# Patient Record
Sex: Female | Born: 1970 | Race: White | Hispanic: No | Marital: Married | Smoking: Never smoker
Health system: Southern US, Community
[De-identification: ages and names within clinical notes are randomized; demographics above are authoritative.]

## PROBLEM LIST (undated history)

## (undated) DIAGNOSIS — F909 Attention-deficit hyperactivity disorder, unspecified type: Secondary | ICD-10-CM

## (undated) DIAGNOSIS — J309 Allergic rhinitis, unspecified: Secondary | ICD-10-CM

## (undated) DIAGNOSIS — J45909 Unspecified asthma, uncomplicated: Secondary | ICD-10-CM

## (undated) DIAGNOSIS — Z973 Presence of spectacles and contact lenses: Secondary | ICD-10-CM

## (undated) DIAGNOSIS — I499 Cardiac arrhythmia, unspecified: Secondary | ICD-10-CM

## (undated) DIAGNOSIS — Z87442 Personal history of urinary calculi: Secondary | ICD-10-CM

## (undated) DIAGNOSIS — R000 Tachycardia, unspecified: Secondary | ICD-10-CM

## (undated) HISTORY — DX: Attention-deficit hyperactivity disorder, unspecified type: F90.9

## (undated) HISTORY — DX: Personal history of urinary calculi: Z87.442

## (undated) HISTORY — DX: Tachycardia, unspecified: R00.0

## (undated) HISTORY — DX: Unspecified asthma, uncomplicated: J45.909

## (undated) HISTORY — PX: TUBAL LIGATION: SHX77

## (undated) HISTORY — DX: Allergic rhinitis, unspecified: J30.9

## (undated) HISTORY — PX: SMALL INTESTINE SURGERY: SHX150

## (undated) HISTORY — PX: OTHER SURGICAL HISTORY: SHX169

---

## 1997-07-27 ENCOUNTER — Other Ambulatory Visit: Admission: RE | Admit: 1997-07-27 | Discharge: 1997-07-27 | Payer: Self-pay | Admitting: Obstetrics and Gynecology

## 1998-12-03 ENCOUNTER — Other Ambulatory Visit: Admission: RE | Admit: 1998-12-03 | Discharge: 1998-12-03 | Payer: Self-pay | Admitting: Obstetrics and Gynecology

## 1999-12-05 ENCOUNTER — Other Ambulatory Visit: Admission: RE | Admit: 1999-12-05 | Discharge: 1999-12-05 | Payer: Self-pay | Admitting: Obstetrics and Gynecology

## 2000-06-14 ENCOUNTER — Inpatient Hospital Stay (HOSPITAL_COMMUNITY): Admission: AD | Admit: 2000-06-14 | Discharge: 2000-06-14 | Payer: Self-pay | Admitting: Obstetrics and Gynecology

## 2000-06-14 ENCOUNTER — Encounter: Payer: Self-pay | Admitting: Obstetrics and Gynecology

## 2000-07-30 ENCOUNTER — Inpatient Hospital Stay (HOSPITAL_COMMUNITY): Admission: AD | Admit: 2000-07-30 | Discharge: 2000-07-30 | Payer: Self-pay | Admitting: *Deleted

## 2000-08-17 ENCOUNTER — Inpatient Hospital Stay (HOSPITAL_COMMUNITY): Admission: AD | Admit: 2000-08-17 | Discharge: 2000-08-20 | Payer: Self-pay | Admitting: Obstetrics and Gynecology

## 2000-09-20 ENCOUNTER — Other Ambulatory Visit: Admission: RE | Admit: 2000-09-20 | Discharge: 2000-09-20 | Payer: Self-pay | Admitting: Obstetrics and Gynecology

## 2001-09-21 ENCOUNTER — Other Ambulatory Visit: Admission: RE | Admit: 2001-09-21 | Discharge: 2001-09-21 | Payer: Self-pay | Admitting: Obstetrics and Gynecology

## 2003-11-05 ENCOUNTER — Other Ambulatory Visit: Admission: RE | Admit: 2003-11-05 | Discharge: 2003-11-05 | Payer: Self-pay | Admitting: Obstetrics and Gynecology

## 2004-11-06 ENCOUNTER — Other Ambulatory Visit: Admission: RE | Admit: 2004-11-06 | Discharge: 2004-11-06 | Payer: Self-pay | Admitting: Obstetrics and Gynecology

## 2004-11-17 ENCOUNTER — Encounter: Admission: RE | Admit: 2004-11-17 | Discharge: 2004-11-17 | Payer: Self-pay | Admitting: Obstetrics and Gynecology

## 2012-12-22 ENCOUNTER — Other Ambulatory Visit: Payer: Self-pay | Admitting: Family Medicine

## 2012-12-22 ENCOUNTER — Ambulatory Visit
Admission: RE | Admit: 2012-12-22 | Discharge: 2012-12-22 | Disposition: A | Discharge status: Home / Self Care | Payer: Self-pay | Source: Ambulatory Visit | Attending: Family Medicine | Admitting: Family Medicine

## 2012-12-22 DIAGNOSIS — R3129 Other microscopic hematuria: Secondary | ICD-10-CM

## 2012-12-22 DIAGNOSIS — R109 Unspecified abdominal pain: Secondary | ICD-10-CM

## 2013-01-26 ENCOUNTER — Other Ambulatory Visit: Payer: Self-pay | Admitting: Obstetrics and Gynecology

## 2013-01-26 DIAGNOSIS — R928 Other abnormal and inconclusive findings on diagnostic imaging of breast: Secondary | ICD-10-CM

## 2013-02-03 ENCOUNTER — Ambulatory Visit
Admission: RE | Admit: 2013-02-03 | Discharge: 2013-02-03 | Disposition: A | Discharge status: Home / Self Care | Payer: BC Managed Care – PPO | Source: Ambulatory Visit | Attending: Obstetrics and Gynecology | Admitting: Obstetrics and Gynecology

## 2013-02-03 ENCOUNTER — Other Ambulatory Visit: Payer: Self-pay | Admitting: Obstetrics and Gynecology

## 2013-02-03 DIAGNOSIS — R921 Mammographic calcification found on diagnostic imaging of breast: Secondary | ICD-10-CM

## 2013-02-03 DIAGNOSIS — R928 Other abnormal and inconclusive findings on diagnostic imaging of breast: Secondary | ICD-10-CM

## 2013-02-09 ENCOUNTER — Ambulatory Visit
Admission: RE | Admit: 2013-02-09 | Discharge: 2013-02-09 | Disposition: A | Discharge status: Home / Self Care | Payer: BC Managed Care – PPO | Source: Ambulatory Visit | Attending: Obstetrics and Gynecology | Admitting: Obstetrics and Gynecology

## 2013-02-09 DIAGNOSIS — R921 Mammographic calcification found on diagnostic imaging of breast: Secondary | ICD-10-CM

## 2013-11-08 ENCOUNTER — Ambulatory Visit
Admission: RE | Admit: 2013-11-08 | Discharge: 2013-11-08 | Disposition: A | Discharge status: Home / Self Care | Payer: BC Managed Care – PPO | Source: Ambulatory Visit | Attending: Family Medicine | Admitting: Family Medicine

## 2013-11-08 ENCOUNTER — Other Ambulatory Visit: Payer: Self-pay | Admitting: Family Medicine

## 2013-11-08 ENCOUNTER — Other Ambulatory Visit: Payer: BC Managed Care – PPO

## 2013-11-08 DIAGNOSIS — R2241 Localized swelling, mass and lump, right lower limb: Secondary | ICD-10-CM

## 2013-11-08 DIAGNOSIS — M25561 Pain in right knee: Secondary | ICD-10-CM

## 2013-11-08 DIAGNOSIS — R29898 Other symptoms and signs involving the musculoskeletal system: Secondary | ICD-10-CM

## 2013-11-16 ENCOUNTER — Ambulatory Visit
Admission: RE | Admit: 2013-11-16 | Discharge: 2013-11-16 | Disposition: A | Discharge status: Home / Self Care | Payer: BC Managed Care – PPO | Source: Ambulatory Visit | Attending: Family Medicine | Admitting: Family Medicine

## 2013-11-16 DIAGNOSIS — M25561 Pain in right knee: Secondary | ICD-10-CM

## 2013-11-16 DIAGNOSIS — R29898 Other symptoms and signs involving the musculoskeletal system: Secondary | ICD-10-CM

## 2013-11-16 MED ORDER — GADOBENATE DIMEGLUMINE 529 MG/ML IV SOLN
10.0000 mL | Freq: Once | INTRAVENOUS | Status: AC | PRN
  Administered 2013-11-16: 10 mL via INTRAVENOUS

## 2014-01-30 ENCOUNTER — Other Ambulatory Visit: Payer: Self-pay | Admitting: Obstetrics and Gynecology

## 2014-02-01 LAB — CYTOLOGY - PAP

## 2014-02-23 ENCOUNTER — Other Ambulatory Visit: Payer: Self-pay | Admitting: Obstetrics and Gynecology

## 2014-08-31 ENCOUNTER — Other Ambulatory Visit: Payer: Self-pay | Admitting: Obstetrics and Gynecology

## 2014-09-04 LAB — CYTOLOGY - PAP

## 2014-12-10 IMAGING — MG MM DIAGNOSTIC UNILATERAL L
2 series · 2 of 2 positions shown · non-contrast
Comparison: Previous mammograms

ACR Breast Density Category c: The breast tissue is heterogeneously
dense.

CLINICAL DATA: Screening recall for left breast calcifications.

EXAM:
DIGITAL DIAGNOSTIC  left MAMMOGRAM

[L CC]
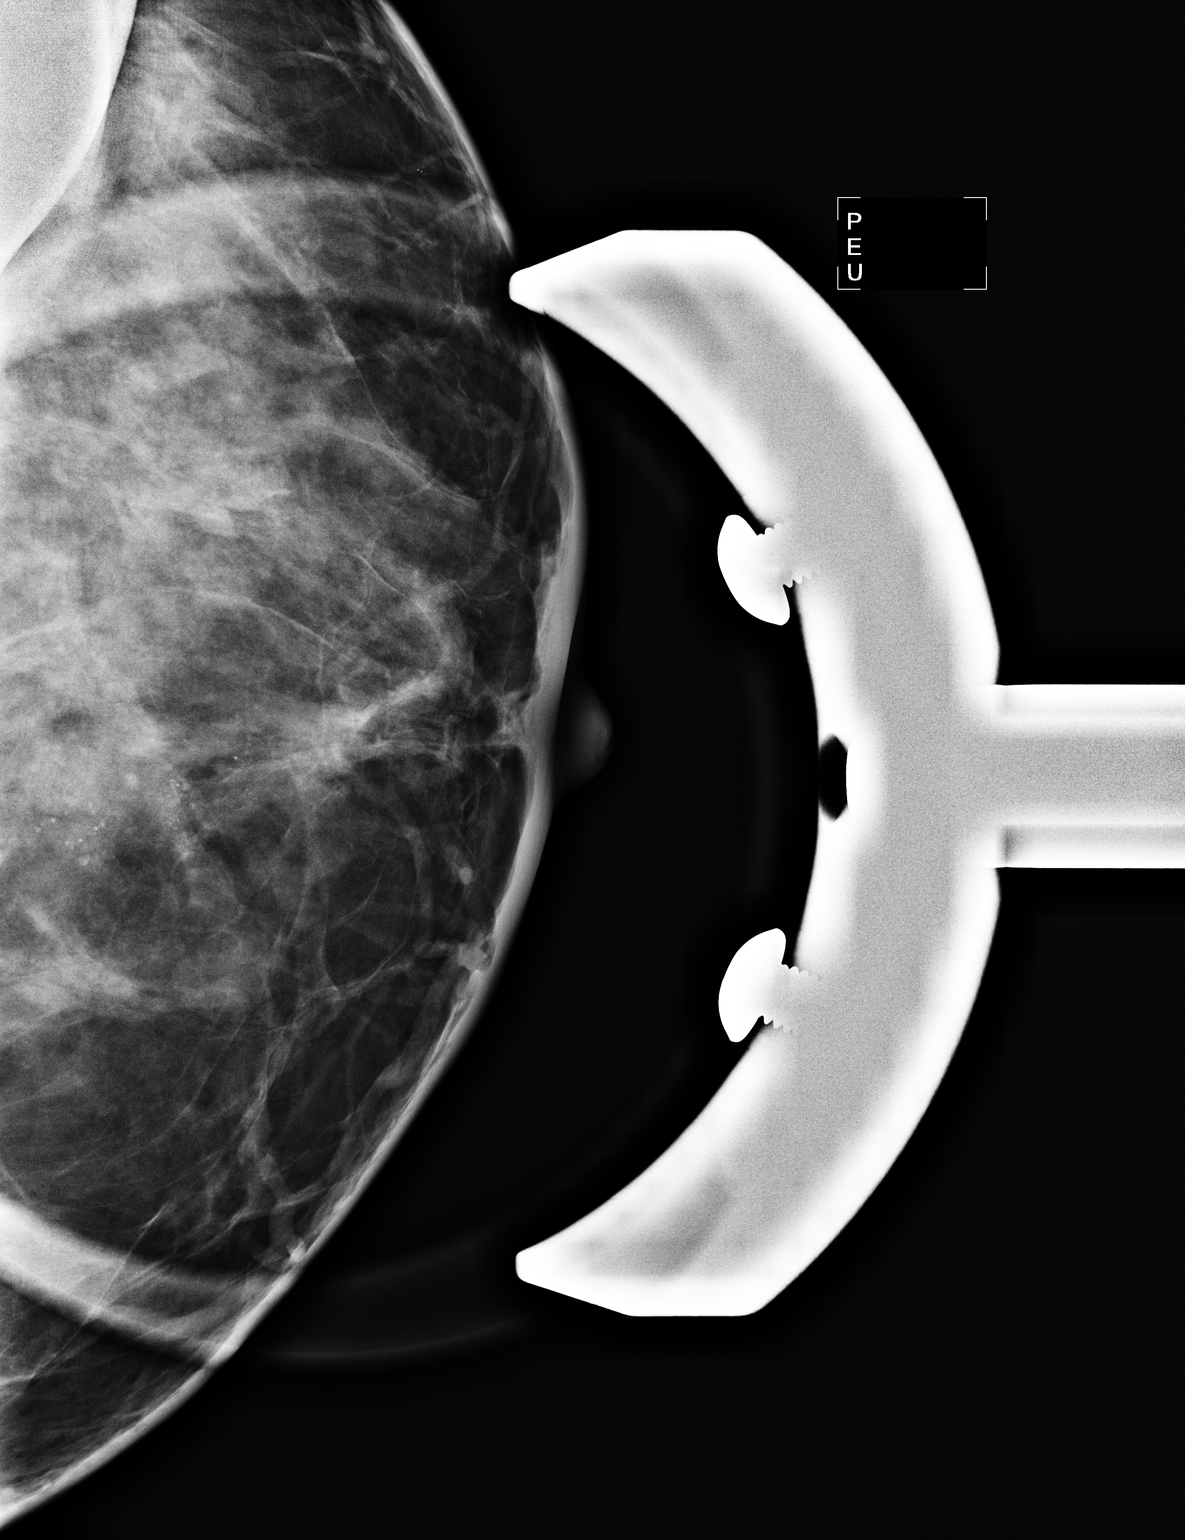

[L ML]
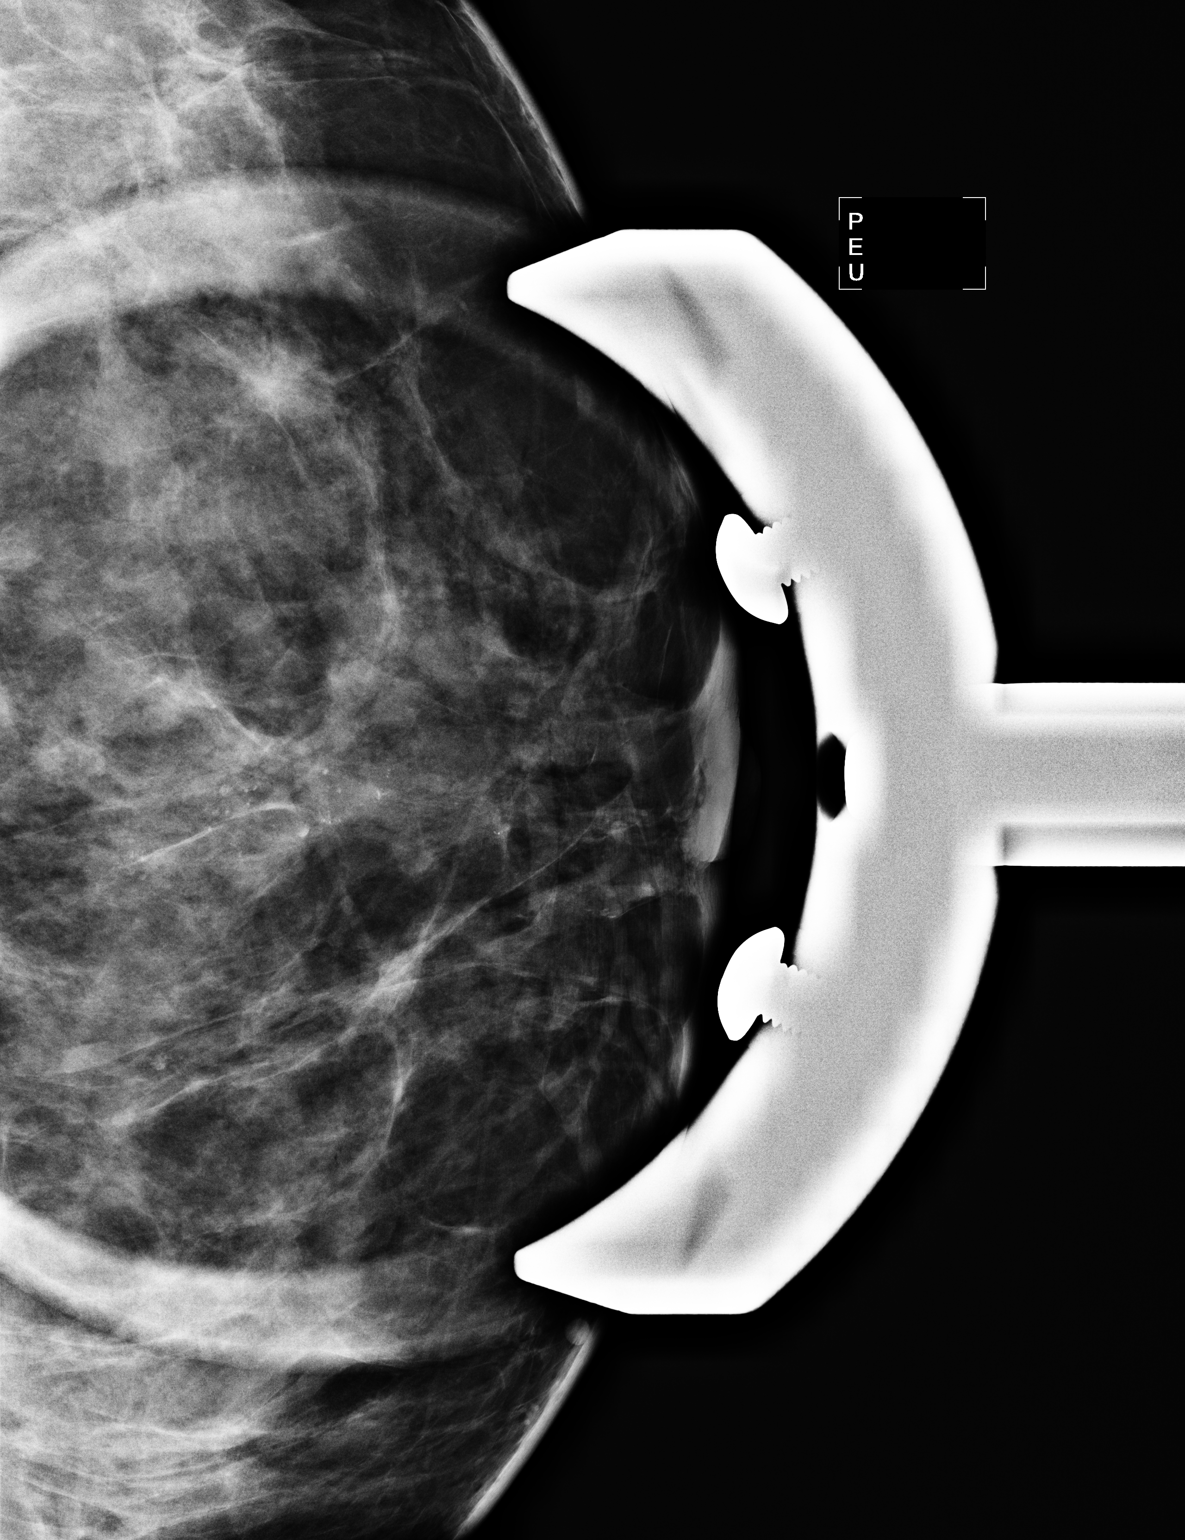

[2 of 2 positions shown; findings below may reference images not displayed]

FINDINGS: There grouped calcifications in the central left breast anterior to
mid depth, confirmed on the additional spot compression
magnification CC and true lateral views, some of which (but not all)
demonstrate layering consistent with milk of calcium.
IMPRESSION: Grouped calcifications in the central left breast, some of which
layer consistent with milk of calcium.

RECOMMENDATION:
The patient was given the option of six-month followup diagnostic
mammogram versus stereotactic biopsy of the calcifications. The
patient elects biopsy of the calcifications, and this is scheduled

I have discussed the findings and recommendations with the patient.
Results were also provided in writing at the conclusion of the
visit. If applicable, a reminder letter will be sent to the patient
regarding the next appointment.

BI-RADS CATEGORY  4: Suspicious abnormality - biopsy should be
considered.

## 2015-03-25 ENCOUNTER — Encounter: Payer: Self-pay | Admitting: Cardiovascular Disease

## 2015-03-25 ENCOUNTER — Ambulatory Visit (INDEPENDENT_AMBULATORY_CARE_PROVIDER_SITE_OTHER): Payer: BC Managed Care – PPO | Admitting: Cardiovascular Disease

## 2015-03-25 VITALS — BP 120/70 | HR 82 | Ht 63.0 in | Wt 128.5 lb

## 2015-03-25 DIAGNOSIS — R0689 Other abnormalities of breathing: Secondary | ICD-10-CM | POA: Diagnosis not present

## 2015-03-25 DIAGNOSIS — I471 Supraventricular tachycardia: Secondary | ICD-10-CM | POA: Diagnosis not present

## 2015-03-25 DIAGNOSIS — J45909 Unspecified asthma, uncomplicated: Secondary | ICD-10-CM | POA: Insufficient documentation

## 2015-03-25 DIAGNOSIS — F909 Attention-deficit hyperactivity disorder, unspecified type: Secondary | ICD-10-CM | POA: Diagnosis not present

## 2015-03-25 DIAGNOSIS — R002 Palpitations: Secondary | ICD-10-CM | POA: Diagnosis not present

## 2015-03-25 MED ORDER — PROPRANOLOL HCL 10 MG PO TABS: 10.0000 mg | ORAL_TABLET | Freq: Three times a day (TID) | ORAL | Status: DC | PRN

## 2015-03-25 NOTE — Assessment & Plan Note (Addendum)
Relatively acute onset of her tachycardia is concerning for atrial tachycardia Unable to exclude sinus tachycardia Less likely a supraventricular tachycardia given the slower speed Rhythm seems regular, less likely atrial fibrillation Recommended if symptoms come back when they are severe, 48-hour Holter monitor or 30 day event monitor could be ordered For now would recommend propranolol 10 mg with repeat 1 if needed for symptoms If symptoms persist through the day, could take metoprolol tartrate 25 mg. She will call if she would like the latter Etiology of her general malaise, extreme fatigue is unclear, unable to exclude viral etiology. This may have triggered her symptoms rather than her arrhythmia triggering her fatigue EKG normal, clinical examination normal, no echocardiogram ordered at this time given likely normal cardiac structure

## 2015-03-25 NOTE — Patient Instructions (Addendum)
You are doing well.  I think you may be having episodes of atrial tachycardia Rate typically 100 to 150 bpm  Please take propranolol as needed for tachycardia 1 to 2 up to every 4 to 6 hours  Please call if you would like a 24/48 or 30 day monitor   Please call Claudia Hawkins if you have new issues that need to be addressed before your next appt.    Tachycardia Tachycardia is a faster than normal heartbeat (more than 100 beats per minute). In adults, the heart normally beats between 60 and 100 times a minute. A fast heartbeat may be a normal response to exercise or stress. It does not necessarily mean that something is wrong. However, sometimes when your heart beats too fast it may not be able to pump enough blood to the rest of your body. This can result in chest pain, shortness of breath, dizziness, and even fainting. Nonspecific tachycardia means that the specific cause or pattern of your tachycardia is unknown. CAUSES  Tachycardia may be harmless or it may be due to a more serious underlying cause. Possible causes of tachycardia include:  Exercise or exertion.  Fever.  Pain or injury.  Infection.  Loss of body fluids (dehydration).  Overactive thyroid.  Lack of red blood cells (anemia).  Anxiety and stress.  Alcohol.  Caffeine.  Tobacco products.  Diet pills.  Illegal drugs.  Heart disease. SYMPTOMS  Rapid or irregular heartbeat (palpitations).  Suddenly feeling your heart beating (cardiac awareness).  Dizziness.  Tiredness (fatigue).  Shortness of breath.  Chest pain.  Nausea.  Fainting. DIAGNOSIS  Your caregiver will perform a physical exam and take your medical history. In some cases, a heart specialist (cardiologist) may be consulted. Your caregiver may also order:  Blood tests.  Electrocardiography. This test records the electrical activity of your heart.  A heart monitoring test. TREATMENT  Treatment will depend on the likely cause of your  tachycardia. The goal is to treat the underlying cause of your tachycardia. Treatment methods may include:  Replacement of fluids or blood through an intravenous (IV) tube for moderate to severe dehydration or anemia.  New medicines or changes in your current medicines.  Diet and lifestyle changes.  Treatment for certain infections.  Stress relief or relaxation methods. HOME CARE INSTRUCTIONS   Rest.  Drink enough fluids to keep your urine clear or pale yellow.  Do not smoke.  Avoid:  Caffeine.  Tobacco.  Alcohol.  Chocolate.  Stimulants such as over-the-counter diet pills or pills that help you stay awake.  Situations that cause anxiety or stress.  Illegal drugs such as marijuana, phencyclidine (PCP), and cocaine.  Only take medicine as directed by your caregiver.  Keep all follow-up appointments as directed by your caregiver. SEEK IMMEDIATE MEDICAL CARE IF:   You have pain in your chest, upper arms, jaw, or neck.  You become weak, dizzy, or feel faint.  You have palpitations that will not go away.  You vomit, have diarrhea, or pass blood in your stool.  Your skin is cool, pale, and wet.  You have a fever that will not go away with rest, fluids, and medicine. MAKE SURE YOU:   Understand these instructions.  Will watch your condition.  Will get help right away if you are not doing well or get worse.   This information is not intended to replace advice given to you by your health care provider. Make sure you discuss any questions you have with your health  care provider.   Document Released: 06/04/2004 Document Revised: 07/20/2011 Document Reviewed: 11/09/2014 Elsevier Interactive Patient Education [employer]

## 2015-03-25 NOTE — Assessment & Plan Note (Signed)
Stable, no recent exacerbation 

## 2015-03-25 NOTE — Progress Notes (Signed)
Patient ID: Claudia Hawkins, female    DOB: [birth year], 44 y.o.   MRN: [id number]  HPI Comments: Miss Claudia Hawkins is a very pleasant [age] year old woman with history of ADHD, asthma who presents for evaluation of tachycardia  She reports symptoms date at least one month. She has acute onset of tachycardia without warning, heart rates typically more than 100. Friend of hers measured heart rate 120 bpm. There is a notable rapid onset of the tachycardia unrelated to exertion or stress. Never had symptoms like this before. Symptoms lasted 10 days or more, now more rare. Was bad in middle of October when she saw primary care. Typically can exert herself without any symptoms. Around the same time as her tachycardia, she had general malaise, extreme fatigue. Etiology unclear. Not notably sick at the time She has been on Vyvanse for many years Currently feels well with only rare episodes. She does appreciate the tachycardia in the daytime as well as nighttime. Typically does not wake her from sleep  Recent lab work reviewed with her in detail showing normal CBC, BMP, TSH  EKG on today's visit shows normal sinus rhythm with rate 82 bpm, no significant ST or T-wave changes   No Known Allergies  No current outpatient prescriptions on file prior to visit.   No current facility-administered medications on file prior to visit.    Past Medical History  Diagnosis Date  . Allergic rhinitis   . Asthma   . History of kidney stones   . Adult ADHD     Past Surgical History  Procedure Laterality Date  . Tubal ligation      Social History  reports that she has never smoked. She does not have any smokeless tobacco history on file. She reports that she drinks alcohol. She reports that she does not use illicit drugs.  Family History family history includes Hypertension in her father.    Review of Systems  Constitutional: Negative.   Respiratory: Negative.   Cardiovascular: Positive for  palpitations.       Tachycardia  Gastrointestinal: Negative.   Musculoskeletal: Negative.   Skin: Negative.   Neurological: Negative.   Hematological: Negative.   Psychiatric/Behavioral: Negative.   All other systems reviewed and are negative.   BP 120/70 mmHg  Pulse 82  Ht 5\' 3"  (1.6 m)  Wt 128 lb 8 oz (58.287 kg)  BMI 22.77 kg/m2  Physical Exam  Constitutional: She is oriented to person, place, and time. She appears well-developed and well-nourished.  HENT:  Head: Normocephalic.  Nose: Nose normal.  Mouth/Throat: Oropharynx is clear and moist.  Eyes: Conjunctivae are normal. Pupils are equal, round, and reactive to light.  Neck: Normal range of motion. Neck supple. No JVD present.  Cardiovascular: Normal rate, regular rhythm, normal heart sounds and intact distal pulses.  Exam reveals no gallop and no friction rub.   No murmur heard. Pulmonary/Chest: Effort normal and breath sounds normal. No respiratory distress. She has no wheezes. She has no rales. She exhibits no tenderness.  Abdominal: Soft. Bowel sounds are normal. She exhibits no distension. There is no tenderness.  Musculoskeletal: Normal range of motion. She exhibits no edema or tenderness.  Lymphadenopathy:    She has no cervical adenopathy.  Neurological: She is alert and oriented to person, place, and time. Coordination normal.  Skin: Skin is warm and dry. No rash noted. No erythema.  Psychiatric: She has a normal mood and affect. Her behavior is normal. Judgment and thought content normal.

## 2015-03-25 NOTE — Assessment & Plan Note (Signed)
Currently on Vyvanse for many years No plan to discontinue the medication Likely unrelated

## 2015-07-02 ENCOUNTER — Other Ambulatory Visit: Payer: Self-pay | Admitting: Family Medicine

## 2015-07-02 ENCOUNTER — Ambulatory Visit
Admission: RE | Admit: 2015-07-02 | Discharge: 2015-07-02 | Disposition: A | Discharge status: Home / Self Care | Payer: BC Managed Care – PPO | Source: Ambulatory Visit | Attending: Family Medicine | Admitting: Family Medicine

## 2015-07-02 DIAGNOSIS — R05 Cough: Secondary | ICD-10-CM

## 2015-07-02 DIAGNOSIS — R059 Cough, unspecified: Secondary | ICD-10-CM

## 2016-07-17 ENCOUNTER — Other Ambulatory Visit: Payer: Self-pay | Admitting: Obstetrics and Gynecology

## 2016-07-17 DIAGNOSIS — R928 Other abnormal and inconclusive findings on diagnostic imaging of breast: Secondary | ICD-10-CM

## 2016-07-29 ENCOUNTER — Other Ambulatory Visit: Payer: Self-pay | Admitting: Obstetrics and Gynecology

## 2016-07-29 ENCOUNTER — Ambulatory Visit
Admission: RE | Admit: 2016-07-29 | Discharge: 2016-07-29 | Disposition: A | Discharge status: Home / Self Care | Payer: BC Managed Care – PPO | Source: Ambulatory Visit | Attending: Obstetrics and Gynecology | Admitting: Obstetrics and Gynecology

## 2016-07-29 DIAGNOSIS — R928 Other abnormal and inconclusive findings on diagnostic imaging of breast: Secondary | ICD-10-CM

## 2016-07-29 DIAGNOSIS — N6310 Unspecified lump in the right breast, unspecified quadrant: Secondary | ICD-10-CM

## 2016-07-30 ENCOUNTER — Ambulatory Visit
Admission: RE | Admit: 2016-07-30 | Discharge: 2016-07-30 | Disposition: A | Discharge status: Home / Self Care | Payer: BC Managed Care – PPO | Source: Ambulatory Visit | Attending: Obstetrics and Gynecology | Admitting: Obstetrics and Gynecology

## 2016-07-30 ENCOUNTER — Other Ambulatory Visit: Payer: Self-pay | Admitting: Obstetrics and Gynecology

## 2016-07-30 DIAGNOSIS — N6310 Unspecified lump in the right breast, unspecified quadrant: Secondary | ICD-10-CM

## 2017-05-05 ENCOUNTER — Other Ambulatory Visit: Payer: Self-pay | Admitting: Family Medicine

## 2017-05-05 ENCOUNTER — Ambulatory Visit
Admission: RE | Admit: 2017-05-05 | Discharge: 2017-05-05 | Disposition: A | Discharge status: Home / Self Care | Payer: BC Managed Care – PPO | Source: Ambulatory Visit | Attending: Family Medicine | Admitting: Family Medicine

## 2017-05-05 DIAGNOSIS — R52 Pain, unspecified: Secondary | ICD-10-CM

## 2018-10-26 DIAGNOSIS — R000 Tachycardia, unspecified: Secondary | ICD-10-CM | POA: Insufficient documentation

## 2019-10-27 DIAGNOSIS — Z9889 Other specified postprocedural states: Secondary | ICD-10-CM | POA: Insufficient documentation

## 2019-12-05 DIAGNOSIS — U071 COVID-19: Secondary | ICD-10-CM

## 2019-12-05 HISTORY — DX: COVID-19: U07.1

## 2020-01-16 ENCOUNTER — Encounter (HOSPITAL_COMMUNITY): Payer: Self-pay

## 2020-01-16 NOTE — Progress Notes (Signed)
Claudia Hawkins  01/16/2020      Your procedure is scheduled on    01/29/20   Report to [facility]  at      0530 A.M.  Call this number if you have problems the morning of surgery:[phone]  OUR ADDRESS IS [address], WE ARE LOCATED IN THE [facility] WITH [facility].   Remember:  Do not eat food  after midnight.   NO SOLID FOOD AFTER MIDNIGHT THE NIGHT PRIOR TO SURGERY. NOTHING BY MOUTH EXCEPT CLEAR LIQUIDS UNTIL    0430am . PLEASE FINISH ENSURE DRINK PER SURGEON ORDER  WHICH NEEDS TO BE COMPLETED AT 0430am.  Take these medicines the morning of surgery with A SIP OF WATER Propanolol if needed, vyvanse   Do not wear jewelry, make-up or nail polish.  Do not wear lotions, powders, or perfumes, or deoderant.  Do not shave 48 hours prior to surgery.  Men may shave face and neck.  Do not bring valuables to the hospital.  [facility] is not responsible for any belongings or valuables.  Contacts, dentures or bridgework may not be worn into surgery.  Leave your suitcase in the car.  After surgery it may be brought to your room.  For patients admitted to the hospital, discharge time will be determined by your treatment team.  Patients discharged the day of surgery will not be allowed to drive home.     Please read over the following fact sheets that you were given:  [facility] - Preparing for Surgery Before surgery, you can play an important role.  Because skin is not sterile, your skin needs to be as free of germs as possible.  You can reduce the number of germs on your skin by washing with CHG (chlorahexidine gluconate) soap before surgery.  CHG is an antiseptic cleaner which kills germs and bonds with the skin to continue killing germs even after washing. Please DO NOT use if you have an allergy to CHG or antibacterial soaps.  If your skin becomes reddened/irritated stop using the CHG and inform your nurse when you arrive at Short Stay. Do not shave  (including legs and underarms) for at least 48 hours prior to the first CHG shower.  You may shave your face/neck. Please follow these instructions carefully:  1.  Shower with CHG Soap the night before surgery and the  morning of Surgery.  2.  If you choose to wash your hair, wash your hair first as usual with your  normal  shampoo.  3.  After you shampoo, rinse your hair and body thoroughly to remove the  shampoo.                           4.  Use CHG as you would any other liquid soap.  You can apply chg directly  to the skin and wash                       Gently with a scrungie or clean washcloth.  5.  Apply the CHG Soap to your body ONLY FROM THE NECK DOWN.   Do not use on face/ open                           Wound or open sores. Avoid contact with eyes, ears mouth and genitals (private parts).  Wash face,  Genitals (private parts) with your normal soap.             6.  Wash thoroughly, paying special attention to the area where your surgery  will be performed.  7.  Thoroughly rinse your body with warm water from the neck down.  8.  DO NOT shower/wash with your normal soap after using and rinsing off  the CHG Soap.                9.  Pat yourself dry with a clean towel.            10.  Wear clean pajamas.            11.  Place clean sheets on your bed the night of your first shower and do not  sleep with pets. Day of Surgery : Do not apply any lotions/deodorants the morning of surgery.  Please wear clean clothes to the hospital/surgery center.  FAILURE TO FOLLOW THESE INSTRUCTIONS MAY RESULT IN THE CANCELLATION OF YOUR SURGERY PATIENT SIGNATURE_________________________________  NURSE SIGNATURE__________________________________  ________________________________________________________________________

## 2020-01-17 ENCOUNTER — Other Ambulatory Visit: Payer: Self-pay

## 2020-01-17 ENCOUNTER — Encounter (HOSPITAL_COMMUNITY): Payer: Self-pay

## 2020-01-17 ENCOUNTER — Encounter (HOSPITAL_COMMUNITY)
Admission: RE | Admit: 2020-01-17 | Discharge: 2020-01-17 | Disposition: A | Discharge status: Home / Self Care | Payer: BC Managed Care – PPO | Source: Ambulatory Visit | Attending: Obstetrics and Gynecology | Admitting: Obstetrics and Gynecology

## 2020-01-17 ENCOUNTER — Encounter (INDEPENDENT_AMBULATORY_CARE_PROVIDER_SITE_OTHER): Payer: Self-pay

## 2020-01-17 DIAGNOSIS — Z01818 Encounter for other preprocedural examination: Secondary | ICD-10-CM | POA: Diagnosis not present

## 2020-01-17 HISTORY — DX: Cardiac arrhythmia, unspecified: I49.9

## 2020-01-17 LAB — CBC
HCT: 44.6 % (ref 36.0–46.0)
Hemoglobin: 14.8 g/dL (ref 12.0–15.0)
MCH: 30 pg (ref 26.0–34.0)
MCHC: 33.2 g/dL (ref 30.0–36.0)
MCV: 90.5 fL (ref 80.0–100.0)
Platelets: 257 10*3/uL (ref 150–400)
RBC: 4.93 MIL/uL (ref 3.87–5.11)
RDW: 12.5 % (ref 11.5–15.5)
WBC: 7.8 10*3/uL (ref 4.0–10.5)
nRBC: 0 % (ref 0.0–0.2)

## 2020-01-25 ENCOUNTER — Inpatient Hospital Stay (HOSPITAL_COMMUNITY)
Admission: RE | Admit: 2020-01-25 | Discharge: 2020-01-25 | Disposition: A | Discharge status: Home / Self Care | Payer: BC Managed Care – PPO | Source: Ambulatory Visit

## 2020-01-25 ENCOUNTER — Encounter (HOSPITAL_BASED_OUTPATIENT_CLINIC_OR_DEPARTMENT_OTHER): Payer: Self-pay | Admitting: Obstetrics and Gynecology

## 2020-01-25 NOTE — Progress Notes (Signed)
Pt arrived to pre-procedure covid testing and presented a copy of her positive covid test on 12/05/2019. Pt will not require another covid test prior to procedure d/t being positive in the last 90 days per hospital guidelines.   Viviano Simas, RN

## 2020-01-25 NOTE — Progress Notes (Signed)
Patient spoke to by phone patient aware to bring copy of covid positive test result  From 7-27-2021with her day of surgery

## 2020-01-26 NOTE — H&P (Signed)
Claudia Hawkins  is an 49 y.o. female. She has discomfort with pelvic prolapse and urinary incontinence. Also S/P LEEP in '14 and now with persistent CIN I.  Pertinent Gynecological History: Menses:  Bleeding: none Contraception: tubal ligation DES exposure: denies Blood transfusions: none Sexually transmitted diseases: no past history Previous GYN Procedures: EMA, BTL, LEEP  Last mammogram: normal Date: 2021 Last pap: abnormal: CIN I Date: 6/21 OB History: G3, P3   Menstrual History: Menarche age: [age]kn[age] No LMP recorded. Patient has had an ablation.    Past Medical History:  Diagnosis Date  . Adult ADHD   . Allergic rhinitis   . Asthma    very rarely uses albuterol   . COVID-19 12/05/2019  . Dysrhythmia    tachycardia   . History of kidney stones     Past Surgical History:  Procedure Laterality Date  . right knee surgery     . right wrist surgery     . torn meniscus right knee surgery     . TUBAL LIGATION      Family History  Problem Relation Age of Onset  . Hypertension Father     Social History:  reports that she has never smoked. She has never used smokeless tobacco. She reports current alcohol use. She reports that she does not use drugs.  Allergies:  Allergies  Allergen Reactions  . Bactroban [Mupirocin] Itching and Rash    At application site   . Benzalkonium Chloride Rash  . Neosporin [Bacitracin-Polymyxin B] Itching and Rash    At application site  . Oxycontin [Oxycodone] Hives, Itching and Rash  . Sulfa Antibiotics Hives, Diarrhea, Nausea And Vomiting and Rash    No medications prior to admission.    Review of Systems  Constitutional: Negative for fever.    There were no vitals taken for this visit. Physical Exam Cardiovascular:     Rate and Rhythm: Normal rate.  Pulmonary:     Effort: Pulmonary effort is normal.  Genitourinary:    Comments: Uterus first degree prolapse Cysotcele second degree prolapse Rectocele first degree  prolapse Skin:    Findings: Rash present.     No results found for this or any previous visit (from the past 24 hour(s)).  No results found.  Assessment/Plan: 49 yo G3P3 S/P BTL and EMA Symptomatic pelvic prolapse Urinary incontinence Persistent CIN I D/W LAVH/BSO/A&P repair/TOT/possible SSLS D/W risks including infection, organ damage, bleeding/transfusion-HIV/Hep, DVT/PE, pneumonia, laparotomy, urinary retention, prolonged bladder catheterization, erosion of sling into vagina/urethra/bladder, return to OR, persistent or recurrent urinary incontinence, recurrent prolapse, vulvar pain/numbness.  Roselle Locus II 01/26/2020, 2:29 PM

## 2020-01-29 ENCOUNTER — Observation Stay (HOSPITAL_BASED_OUTPATIENT_CLINIC_OR_DEPARTMENT_OTHER)
Admission: RE | Admit: 2020-01-29 | Discharge: 2020-01-30 | Disposition: A | Discharge status: Home / Self Care | Payer: BC Managed Care – PPO | Attending: Obstetrics and Gynecology | Admitting: Obstetrics and Gynecology

## 2020-01-29 ENCOUNTER — Observation Stay (HOSPITAL_BASED_OUTPATIENT_CLINIC_OR_DEPARTMENT_OTHER): Payer: BC Managed Care – PPO | Admitting: Anesthesiology

## 2020-01-29 ENCOUNTER — Encounter (HOSPITAL_BASED_OUTPATIENT_CLINIC_OR_DEPARTMENT_OTHER): Payer: Self-pay | Admitting: Obstetrics and Gynecology

## 2020-01-29 ENCOUNTER — Encounter (HOSPITAL_BASED_OUTPATIENT_CLINIC_OR_DEPARTMENT_OTHER)
Admission: RE | Disposition: A | Discharge status: Home / Self Care | Payer: Self-pay | Source: Home / Self Care | Attending: Obstetrics and Gynecology

## 2020-01-29 ENCOUNTER — Other Ambulatory Visit: Payer: Self-pay

## 2020-01-29 DIAGNOSIS — N393 Stress incontinence (female) (male): Secondary | ICD-10-CM | POA: Insufficient documentation

## 2020-01-29 DIAGNOSIS — J45909 Unspecified asthma, uncomplicated: Secondary | ICD-10-CM | POA: Insufficient documentation

## 2020-01-29 DIAGNOSIS — Z0184 Encounter for antibody response examination: Secondary | ICD-10-CM | POA: Insufficient documentation

## 2020-01-29 DIAGNOSIS — N819 Female genital prolapse, unspecified: Principal | ICD-10-CM | POA: Diagnosis present

## 2020-01-29 HISTORY — PX: BLADDER SUSPENSION: SHX72

## 2020-01-29 HISTORY — PX: ANTERIOR AND POSTERIOR REPAIR: SHX5121

## 2020-01-29 HISTORY — PX: LAPAROSCOPIC VAGINAL HYSTERECTOMY WITH SALPINGO OOPHORECTOMY: SHX6681

## 2020-01-29 LAB — TYPE AND SCREEN
ABO/RH(D): B POS
Antibody Screen: NEGATIVE

## 2020-01-29 LAB — ABO/RH: ABO/RH(D): B POS

## 2020-01-29 SURGERY — HYSTERECTOMY, VAGINAL, LAPAROSCOPY-ASSISTED, WITH SALPINGO-OOPHORECTOMY
Anesthesia: General | Site: Vagina

## 2020-01-29 MED ORDER — LACTATED RINGERS IV SOLN: INTRAVENOUS | Status: DC

## 2020-01-29 MED ORDER — ONDANSETRON HCL 4 MG PO TABS: 4.0000 mg | ORAL_TABLET | Freq: Four times a day (QID) | ORAL | Status: DC | PRN

## 2020-01-29 MED ORDER — ROCURONIUM BROMIDE 10 MG/ML (PF) SYRINGE
PREFILLED_SYRINGE | INTRAVENOUS | Status: AC
  Filled 2020-01-29: qty 10

## 2020-01-29 MED ORDER — PHENYLEPHRINE 40 MCG/ML (10ML) SYRINGE FOR IV PUSH (FOR BLOOD PRESSURE SUPPORT)
PREFILLED_SYRINGE | INTRAVENOUS | Status: DC | PRN
  Administered 2020-01-29: 40 ug via INTRAVENOUS
  Administered 2020-01-29: 80 ug via INTRAVENOUS
  Administered 2020-01-29: 40 ug via INTRAVENOUS

## 2020-01-29 MED ORDER — METHOCARBAMOL 500 MG PO TABS
500.0000 mg | ORAL_TABLET | Freq: Four times a day (QID) | ORAL | Status: DC | PRN
  Administered 2020-01-29 – 2020-01-30 (×2): 500 mg via ORAL

## 2020-01-29 MED ORDER — ALUM & MAG HYDROXIDE-SIMETH 200-200-20 MG/5ML PO SUSP: 30.0000 mL | ORAL | Status: DC | PRN

## 2020-01-29 MED ORDER — DEXAMETHASONE SODIUM PHOSPHATE 4 MG/ML IJ SOLN
INTRAMUSCULAR | Status: DC | PRN
  Administered 2020-01-29: 10 mg via INTRAVENOUS

## 2020-01-29 MED ORDER — ALBUTEROL SULFATE HFA 108 (90 BASE) MCG/ACT IN AERS: 1.0000 | INHALATION_SPRAY | Freq: Four times a day (QID) | RESPIRATORY_TRACT | Status: DC | PRN

## 2020-01-29 MED ORDER — ACETAMINOPHEN 325 MG PO TABS
ORAL_TABLET | ORAL | Status: AC
  Filled 2020-01-29: qty 1

## 2020-01-29 MED ORDER — ACETAMINOPHEN 500 MG PO TABS
ORAL_TABLET | ORAL | Status: AC
  Filled 2020-01-29: qty 2

## 2020-01-29 MED ORDER — TRAMADOL HCL 50 MG PO TABS
50.0000 mg | ORAL_TABLET | Freq: Four times a day (QID) | ORAL | Status: DC | PRN
  Administered 2020-01-29: 50 mg via ORAL

## 2020-01-29 MED ORDER — ONDANSETRON HCL 4 MG/2ML IJ SOLN
INTRAMUSCULAR | Status: DC | PRN
  Administered 2020-01-29: 4 mg via INTRAVENOUS

## 2020-01-29 MED ORDER — LIDOCAINE HCL (CARDIAC) PF 100 MG/5ML IV SOSY
PREFILLED_SYRINGE | INTRAVENOUS | Status: DC | PRN
  Administered 2020-01-29: 100 mg via INTRAVENOUS

## 2020-01-29 MED ORDER — MIDAZOLAM HCL 2 MG/2ML IJ SOLN
INTRAMUSCULAR | Status: AC
  Filled 2020-01-29: qty 2

## 2020-01-29 MED ORDER — HYDROMORPHONE HCL 1 MG/ML IJ SOLN
INTRAMUSCULAR | Status: AC
  Filled 2020-01-29: qty 1

## 2020-01-29 MED ORDER — LIDOCAINE HCL (PF) 2 % IJ SOLN
INTRAMUSCULAR | Status: DC | PRN
  Administered 2020-01-29: 1.5 mg/kg/h via INTRADERMAL

## 2020-01-29 MED ORDER — ACETAMINOPHEN 325 MG PO TABS
650.0000 mg | ORAL_TABLET | ORAL | Status: DC | PRN
  Administered 2020-01-29: 650 mg via ORAL

## 2020-01-29 MED ORDER — SODIUM CHLORIDE 0.9 % IR SOLN
Status: DC | PRN
  Administered 2020-01-29: 200 mL via INTRAVESICAL

## 2020-01-29 MED ORDER — CEFAZOLIN SODIUM-DEXTROSE 2-4 GM/100ML-% IV SOLN
2.0000 g | INTRAVENOUS | Status: AC
  Administered 2020-01-29: 2 g via INTRAVENOUS

## 2020-01-29 MED ORDER — CELECOXIB 200 MG PO CAPS
200.0000 mg | ORAL_CAPSULE | Freq: Once | ORAL | Status: AC
  Administered 2020-01-29: 200 mg via ORAL

## 2020-01-29 MED ORDER — POVIDONE-IODINE 10 % EX SWAB
2.0000 "application " | Freq: Once | CUTANEOUS | Status: AC
  Administered 2020-01-29: 2 via TOPICAL

## 2020-01-29 MED ORDER — BUPIVACAINE HCL (PF) 0.5 % IJ SOLN
INTRAMUSCULAR | Status: DC | PRN
  Administered 2020-01-29: 15 mL

## 2020-01-29 MED ORDER — ACETAMINOPHEN 500 MG PO TABS
1000.0000 mg | ORAL_TABLET | Freq: Once | ORAL | Status: AC
  Administered 2020-01-29: 1000 mg via ORAL

## 2020-01-29 MED ORDER — SOD CITRATE-CITRIC ACID 500-334 MG/5ML PO SOLN: 30.0000 mL | ORAL | Status: DC

## 2020-01-29 MED ORDER — FENTANYL CITRATE (PF) 100 MCG/2ML IJ SOLN
25.0000 ug | INTRAMUSCULAR | Status: DC | PRN
  Administered 2020-01-29: 50 ug via INTRAVENOUS
  Administered 2020-01-29: 25 ug via INTRAVENOUS

## 2020-01-29 MED ORDER — SCOPOLAMINE 1 MG/3DAYS TD PT72
MEDICATED_PATCH | TRANSDERMAL | Status: AC
  Filled 2020-01-29: qty 1

## 2020-01-29 MED ORDER — MIDAZOLAM HCL 5 MG/5ML IJ SOLN
INTRAMUSCULAR | Status: DC | PRN
  Administered 2020-01-29: 2 mg via INTRAVENOUS

## 2020-01-29 MED ORDER — ROCURONIUM BROMIDE 100 MG/10ML IV SOLN
INTRAVENOUS | Status: DC | PRN
  Administered 2020-01-29: 75 mg via INTRAVENOUS

## 2020-01-29 MED ORDER — ORAL CARE MOUTH RINSE: 15.0000 mL | Freq: Once | OROMUCOSAL | Status: DC

## 2020-01-29 MED ORDER — PROPOFOL 10 MG/ML IV BOLUS
INTRAVENOUS | Status: AC
  Filled 2020-01-29: qty 20

## 2020-01-29 MED ORDER — MENTHOL 3 MG MT LOZG: 1.0000 | LOZENGE | OROMUCOSAL | Status: DC | PRN

## 2020-01-29 MED ORDER — DEXAMETHASONE SODIUM PHOSPHATE 10 MG/ML IJ SOLN
INTRAMUSCULAR | Status: AC
  Filled 2020-01-29: qty 1

## 2020-01-29 MED ORDER — PROPOFOL 10 MG/ML IV BOLUS
INTRAVENOUS | Status: DC | PRN
  Administered 2020-01-29: 140 mg via INTRAVENOUS

## 2020-01-29 MED ORDER — CEFAZOLIN SODIUM-DEXTROSE 2-4 GM/100ML-% IV SOLN
INTRAVENOUS | Status: AC
  Filled 2020-01-29: qty 100

## 2020-01-29 MED ORDER — ONDANSETRON HCL 4 MG/2ML IJ SOLN: 4.0000 mg | Freq: Four times a day (QID) | INTRAMUSCULAR | Status: DC | PRN

## 2020-01-29 MED ORDER — LIDOCAINE HCL 2 % IJ SOLN
INTRAMUSCULAR | Status: AC
  Filled 2020-01-29: qty 20

## 2020-01-29 MED ORDER — LACTATED RINGERS IV SOLN
INTRAVENOUS | Status: DC
  Administered 2020-01-29: 125 mL via INTRAVENOUS

## 2020-01-29 MED ORDER — HYDROCODONE-ACETAMINOPHEN 5-325 MG PO TABS
1.0000 | ORAL_TABLET | Freq: Four times a day (QID) | ORAL | Status: DC | PRN
  Administered 2020-01-29: 1 via ORAL
  Administered 2020-01-30 (×2): 2 via ORAL

## 2020-01-29 MED ORDER — ACETAMINOPHEN 325 MG PO TABS
ORAL_TABLET | ORAL | Status: AC
  Filled 2020-01-29: qty 2

## 2020-01-29 MED ORDER — ACETAMINOPHEN 10 MG/ML IV SOLN
INTRAVENOUS | Status: AC
  Filled 2020-01-29: qty 100

## 2020-01-29 MED ORDER — SUGAMMADEX SODIUM 200 MG/2ML IV SOLN
INTRAVENOUS | Status: DC | PRN
  Administered 2020-01-29: 200 mg via INTRAVENOUS

## 2020-01-29 MED ORDER — CHLORHEXIDINE GLUCONATE 0.12 % MT SOLN: 15.0000 mL | Freq: Once | OROMUCOSAL | Status: DC

## 2020-01-29 MED ORDER — PHENYLEPHRINE 40 MCG/ML (10ML) SYRINGE FOR IV PUSH (FOR BLOOD PRESSURE SUPPORT)
PREFILLED_SYRINGE | INTRAVENOUS | Status: AC
  Filled 2020-01-29: qty 10

## 2020-01-29 MED ORDER — CELECOXIB 200 MG PO CAPS
ORAL_CAPSULE | ORAL | Status: AC
  Filled 2020-01-29: qty 1

## 2020-01-29 MED ORDER — FENTANYL CITRATE (PF) 250 MCG/5ML IJ SOLN
INTRAMUSCULAR | Status: AC
  Filled 2020-01-29: qty 5

## 2020-01-29 MED ORDER — METHOCARBAMOL 500 MG PO TABS
ORAL_TABLET | ORAL | Status: AC
  Filled 2020-01-29: qty 1

## 2020-01-29 MED ORDER — PROMETHAZINE HCL 25 MG/ML IJ SOLN: 6.2500 mg | INTRAMUSCULAR | Status: DC | PRN

## 2020-01-29 MED ORDER — PROPRANOLOL HCL 10 MG PO TABS
10.0000 mg | ORAL_TABLET | Freq: Three times a day (TID) | ORAL | Status: DC | PRN
  Filled 2020-01-29: qty 1

## 2020-01-29 MED ORDER — SCOPOLAMINE 1 MG/3DAYS TD PT72
1.0000 | MEDICATED_PATCH | TRANSDERMAL | Status: DC
  Administered 2020-01-29: 1.5 mg via TRANSDERMAL

## 2020-01-29 MED ORDER — ONDANSETRON HCL 4 MG/2ML IJ SOLN
INTRAMUSCULAR | Status: AC
  Filled 2020-01-29: qty 2

## 2020-01-29 MED ORDER — IBUPROFEN 200 MG PO TABS
600.0000 mg | ORAL_TABLET | Freq: Four times a day (QID) | ORAL | Status: DC | PRN
  Administered 2020-01-29 – 2020-01-30 (×2): 600 mg via ORAL

## 2020-01-29 MED ORDER — TRAMADOL HCL 50 MG PO TABS
ORAL_TABLET | ORAL | Status: AC
  Filled 2020-01-29: qty 1

## 2020-01-29 MED ORDER — ESTRADIOL 0.1 MG/GM VA CREA
TOPICAL_CREAM | VAGINAL | Status: AC
  Filled 2020-01-29: qty 42.5

## 2020-01-29 MED ORDER — HYDROMORPHONE HCL 1 MG/ML IJ SOLN
0.2000 mg | INTRAMUSCULAR | Status: DC | PRN
  Administered 2020-01-29: 0.3 mg via INTRAVENOUS
  Administered 2020-01-29: 0.2 mg via INTRAVENOUS

## 2020-01-29 MED ORDER — FENTANYL CITRATE (PF) 100 MCG/2ML IJ SOLN
INTRAMUSCULAR | Status: DC | PRN
Start: 2020-01-29 — End: 2020-01-29
  Administered 2020-01-29: 50 ug via INTRAVENOUS
  Administered 2020-01-29: 100 ug via INTRAVENOUS

## 2020-01-29 MED ORDER — LIDOCAINE-EPINEPHRINE 0.5 %-1:200000 IJ SOLN
INTRAMUSCULAR | Status: DC | PRN
  Administered 2020-01-29: 22 mL

## 2020-01-29 MED ORDER — ESTRADIOL 0.1 MG/GM VA CREA
TOPICAL_CREAM | VAGINAL | Status: DC | PRN
  Administered 2020-01-29: 1 via VAGINAL

## 2020-01-29 MED ORDER — SODIUM CHLORIDE 0.9 % IR SOLN
Status: DC | PRN
  Administered 2020-01-29: 200 mL

## 2020-01-29 MED ORDER — LISDEXAMFETAMINE DIMESYLATE 30 MG PO CAPS: 60.0000 mg | ORAL_CAPSULE | Freq: Every day | ORAL | Status: DC

## 2020-01-29 MED ORDER — HYDROCODONE-ACETAMINOPHEN 5-325 MG PO TABS
ORAL_TABLET | ORAL | Status: AC
  Filled 2020-01-29: qty 1

## 2020-01-29 MED ORDER — IBUPROFEN 200 MG PO TABS
ORAL_TABLET | ORAL | Status: AC
  Filled 2020-01-29: qty 3

## 2020-01-29 MED ORDER — MAGNESIUM HYDROXIDE 400 MG/5ML PO SUSP
30.0000 mL | Freq: Every day | ORAL | Status: DC | PRN
  Filled 2020-01-29: qty 30

## 2020-01-29 MED ORDER — LIDOCAINE 2% (20 MG/ML) 5 ML SYRINGE
INTRAMUSCULAR | Status: AC
  Filled 2020-01-29: qty 5

## 2020-01-29 MED ORDER — FENTANYL CITRATE (PF) 100 MCG/2ML IJ SOLN
INTRAMUSCULAR | Status: AC
  Filled 2020-01-29: qty 2

## 2020-01-29 MED ORDER — MONTELUKAST SODIUM 10 MG PO TABS
10.0000 mg | ORAL_TABLET | Freq: Every day | ORAL | Status: DC
  Administered 2020-01-29: 10 mg via ORAL
  Filled 2020-01-29 (×3): qty 1

## 2020-01-29 SURGICAL SUPPLY — 87 items
ADH SKN CLS APL DERMABOND .7 (GAUZE/BANDAGES/DRESSINGS) ×4
APL PRP STRL LF DISP 70% ISPRP (MISCELLANEOUS) ×2
BLADE CLIPPER SENSICLIP SURGIC (BLADE) IMPLANT
BLADE SURG 11 STRL SS (BLADE) ×4 IMPLANT
BLADE SURG 15 STRL LF DISP TIS (BLADE) ×2 IMPLANT
BLADE SURG 15 STRL SS (BLADE) ×4
CANISTER SUCT 3000ML PPV (MISCELLANEOUS) ×4 IMPLANT
CATH FOLEY 2WAY SLVR  5CC 16FR (CATHETERS) ×4
CATH FOLEY 2WAY SLVR 5CC 16FR (CATHETERS) ×2 IMPLANT
CATH ROBINSON RED A/P 16FR (CATHETERS) ×4 IMPLANT
CHLORAPREP W/TINT 26 (MISCELLANEOUS) ×4 IMPLANT
CLOSURE WOUND 1/2 X4 (GAUZE/BANDAGES/DRESSINGS)
CLOTH BEACON ORANGE TIMEOUT ST (SAFETY) ×4 IMPLANT
COVER BACK TABLE 60X90IN (DRAPES) ×4 IMPLANT
COVER MAYO STAND STRL (DRAPES) ×8 IMPLANT
COVER SURGICAL LIGHT HANDLE (MISCELLANEOUS) ×2 IMPLANT
COVER WAND RF STERILE (DRAPES) ×4 IMPLANT
DECANTER SPIKE VIAL GLASS SM (MISCELLANEOUS) IMPLANT
DERMABOND ADVANCED (GAUZE/BANDAGES/DRESSINGS) ×4
DERMABOND ADVANCED .7 DNX12 (GAUZE/BANDAGES/DRESSINGS) ×2 IMPLANT
DEVICE CAPIO SLIM SINGLE (INSTRUMENTS) ×2 IMPLANT
DISSECTOR ROUND CHERRY 3/8 STR (MISCELLANEOUS) IMPLANT
DRSG OPSITE POSTOP 3X4 (GAUZE/BANDAGES/DRESSINGS) ×4 IMPLANT
ELECT REM PT RETURN 9FT ADLT (ELECTROSURGICAL) ×4
ELECTRODE REM PT RTRN 9FT ADLT (ELECTROSURGICAL) ×2 IMPLANT
GAUZE 4X4 16PLY RFD (DISPOSABLE) ×4 IMPLANT
GAUZE PACKING 1 X5 YD ST (GAUZE/BANDAGES/DRESSINGS) ×4 IMPLANT
GAUZE PACKING 2X5 YD STRL (GAUZE/BANDAGES/DRESSINGS) IMPLANT
GLOVE BIO SURGEON STRL SZ8 (GLOVE) ×12 IMPLANT
GLOVE ECLIPSE 6.5 STRL STRAW (GLOVE) ×4 IMPLANT
GOWN STRL REUS W/TWL LRG LVL3 (GOWN DISPOSABLE) ×4 IMPLANT
GOWN STRL REUS W/TWL XL LVL3 (GOWN DISPOSABLE) ×8 IMPLANT
HOLDER FOLEY CATH W/STRAP (MISCELLANEOUS) ×4 IMPLANT
IV NS 1000ML (IV SOLUTION) ×4
IV NS 1000ML BAXH (IV SOLUTION) IMPLANT
IV NS IRRIG 3000ML ARTHROMATIC (IV SOLUTION) ×2 IMPLANT
KIT TURNOVER CYSTO (KITS) ×4 IMPLANT
LIGASURE IMPACT 36 18CM CVD LR (INSTRUMENTS) ×2 IMPLANT
NDL INSUFFLATION 14GA 150MM (NEEDLE) IMPLANT
NDL MAYO CATGUT SZ4 TPR NDL (NEEDLE) ×2 IMPLANT
NEEDLE HYPO 22GX1.5 SAFETY (NEEDLE) ×4 IMPLANT
NEEDLE INSUFFLATION 120MM (ENDOMECHANICALS) ×4 IMPLANT
NEEDLE INSUFFLATION 14GA 150MM (NEEDLE) IMPLANT
NEEDLE MAYO CATGUT SZ4 (NEEDLE) ×4 IMPLANT
NS IRRIG 500ML POUR BTL (IV SOLUTION) ×6 IMPLANT
PACK LAVH (CUSTOM PROCEDURE TRAY) ×4 IMPLANT
PACK TRENDGUARD 450 HYBRID PRO (MISCELLANEOUS) IMPLANT
PACK VAGINAL WOMENS (CUSTOM PROCEDURE TRAY) ×8 IMPLANT
PAD OB MATERNITY 4.3X12.25 (PERSONAL CARE ITEMS) ×4 IMPLANT
PAD PREP 24X48 CUFFED NSTRL (MISCELLANEOUS) ×4 IMPLANT
PLUG CATH AND CAP STER (CATHETERS) ×4 IMPLANT
SCISSORS LAP 5X35 DISP (ENDOMECHANICALS) IMPLANT
SCISSORS LAP 5X45 EPIX DISP (ENDOMECHANICALS) IMPLANT
SEALER TISSUE G2 CVD JAW 45CM (ENDOMECHANICALS) ×4 IMPLANT
SET IRRIG Y TYPE TUR BLADDER L (SET/KITS/TRAYS/PACK) ×4 IMPLANT
SET SUCTION IRRIG HYDROSURG (IRRIGATION / IRRIGATOR) ×2 IMPLANT
SET TUBE SMOKE EVAC HIGH FLOW (TUBING) ×4 IMPLANT
SLING HALO OBTRYX (Sling) ×4 IMPLANT
SLING HALO OBTRYX NDL (Sling) IMPLANT
SOLUTION ELECTROLUBE (MISCELLANEOUS) IMPLANT
SPONGE LAP 4X18 RFD (DISPOSABLE) ×2 IMPLANT
STRIP CLOSURE SKIN 1/2X4 (GAUZE/BANDAGES/DRESSINGS) IMPLANT
SUT CAPIO POLYGLYCOLIC (SUTURE) IMPLANT
SUT MNCRL 0 MO-4 VIOLET 18 CR (SUTURE) ×4 IMPLANT
SUT MNCRL 0 VIOLET 6X18 (SUTURE) IMPLANT
SUT MNCRL AB 0 CT1 27 (SUTURE) IMPLANT
SUT MON AB 2-0 CT1 36 (SUTURE) ×8 IMPLANT
SUT MON AB-0 CT1 36 (SUTURE) IMPLANT
SUT MONOCRYL 0 6X18 (SUTURE)
SUT MONOCRYL 0 MO 4 18  CR/8 (SUTURE) ×12
SUT VIC AB 2-0 CT1 27 (SUTURE) ×4
SUT VIC AB 2-0 CT1 TAPERPNT 27 (SUTURE) ×2 IMPLANT
SUT VIC AB 2-0 CT2 27 (SUTURE) ×12 IMPLANT
SUT VIC AB 2-0 UR6 27 (SUTURE) IMPLANT
SUT VIC AB 4-0 PS2 18 (SUTURE) ×4 IMPLANT
SUT VICRYL 0 UR6 27IN ABS (SUTURE) ×4 IMPLANT
SYR 10ML LL (SYRINGE) ×2 IMPLANT
SYR 30ML LL (SYRINGE) ×4 IMPLANT
SYR BULB IRRIG 60ML STRL (SYRINGE) ×4 IMPLANT
SYR CONTROL 10ML LL (SYRINGE) ×2 IMPLANT
TOWEL OR 17X26 10 PK STRL BLUE (TOWEL DISPOSABLE) ×4 IMPLANT
TRAY FOLEY W/BAG SLVR 14FR LF (SET/KITS/TRAYS/PACK) ×4 IMPLANT
TRENDGUARD 450 HYBRID PRO PACK (MISCELLANEOUS) ×4
TROCAR BLADELESS OPT 5 100 (ENDOMECHANICALS) ×4 IMPLANT
TROCAR XCEL NON-BLD 11X100MML (ENDOMECHANICALS) ×4 IMPLANT
WARMER LAPAROSCOPE (MISCELLANEOUS) ×4 IMPLANT
WATER STERILE IRR 500ML POUR (IV SOLUTION) ×2 IMPLANT

## 2020-01-29 NOTE — Anesthesia Postprocedure Evaluation (Signed)
Anesthesia Post Note  Patient: Claudia Hawkins  Procedure(s) Performed: LAPAROSCOPIC ASSISTED VAGINAL HYSTERECTOMY WITH SALPINGO OOPHORECTOMY (Bilateral Abdomen) ANTERIOR (CYSTOCELE) AND POSTERIOR REPAIR (RECTOCELE) SACROSPINOUS LIGAMENT SUSPENSION (N/A Vagina ) Transobturator sling (N/A Vagina )     Patient location during evaluation: PACU Anesthesia Type: General Level of consciousness: sedated Pain management: pain level controlled Vital Signs Assessment: post-procedure vital signs reviewed and stable Respiratory status: spontaneous breathing and respiratory function stable Cardiovascular status: stable Postop Assessment: no apparent nausea or vomiting Anesthetic complications: no   No complications documented.  Last Vitals:  Vitals:   01/29/20 1130 01/29/20 1135  BP:  114/81  Pulse: 77 83  Resp: 18 16  Temp:  36.6 C  SpO2: 100% 100%    Last Pain:  Vitals:   01/29/20 1135  TempSrc:   PainSc: 4                  [name] DANIEL

## 2020-01-29 NOTE — Anesthesia Preprocedure Evaluation (Signed)
Anesthesia Evaluation  Patient identified by MRN, date of birth, ID band Patient awake    Reviewed: Allergy & Precautions, NPO status , Patient's Chart, lab work & pertinent test results  Airway Mallampati: II  TM Distance: >3 FB Neck ROM: Full    Dental no notable dental hx. (+) Dental Advisory Given   Pulmonary asthma ,    Pulmonary exam normal        Cardiovascular negative cardio ROS Normal cardiovascular exam     Neuro/Psych negative neurological ROS  negative psych ROS   GI/Hepatic negative GI ROS, Neg liver ROS,   Endo/Other  negative endocrine ROS  Renal/GU negative Renal ROS  negative genitourinary   Musculoskeletal negative musculoskeletal ROS (+)   Abdominal   Peds negative pediatric ROS (+)  Hematology negative hematology ROS (+)   Anesthesia Other Findings   Reproductive/Obstetrics negative OB ROS                             Anesthesia Physical Anesthesia Plan  ASA: II  Anesthesia Plan: General   Post-op Pain Management:    Induction: Intravenous  PONV Risk Score and Plan: 4 or greater and Ondansetron, Dexamethasone, Scopolamine patch - Pre-op and Midazolam  Airway Management Planned: Oral ETT  Additional Equipment:   Intra-op Plan:   Post-operative Plan: Extubation in OR  Informed Consent: I have reviewed the patients History and Physical, chart, labs and discussed the procedure including the risks, benefits and alternatives for the proposed anesthesia with the patient or authorized representative who has indicated his/her understanding and acceptance.     Dental advisory given  Plan Discussed with: CRNA and Anesthesiologist  Anesthesia Plan Comments:         Anesthesia Quick Evaluation

## 2020-01-29 NOTE — Op Note (Signed)
NAMESaraiyah Hawkins, Claudia R. MEDICAL RECORD AQ:[id number] ACCOUNT [id number] DATE OF BIRTH:Nov 09, 1970 FACILITY: [facility] LOCATION: [facility] PHYSICIAN:Earnie Rockhold E. Willye Javier II, MD  OPERATIVE REPORT  DATE OF PROCEDURE:  01/29/2020  PREOPERATIVE DIAGNOSES: 1.  Pelvic prolapse. 2.  Stress urinary incontinence.  POSTOPERATIVE DIAGNOSES: 1.  Pelvic prolapse. 2.  Stress urinary incontinence.  PROCEDURE:   1.  Laparoscopically-assisted vaginal hysterectomy with bilateral salpingo-oophorectomy.  2.  Anterior and posterior repair.   2.  Transobturator midurethral sling.  SURGEON:  Harold Hedge, MD   ASSISTANT:  Richarda Overlie, MD   ANESTHESIA:  General with endotracheal intubation.  ESTIMATED BLOOD LOSS: 500 mL.  SPECIMENS:  Uterus, tubes, and ovaries to pathology.  INDICATIONS AND CONSENT:  This patient is a 49 year old patient, status post tubal ligation, status post endometrial ablation, who has symptomatic pelvic prolapse, stress urinary incontinence, cervical dysplasia.  Options have been discussed and  laparoscopically-assisted vaginal hysterectomy with bilateral salpingo-oophorectomy, anterior and posterior repair, possible sacrospinous ligament suspension and transobturator midurethral sling was discussed preoperatively.  Potential risks and  complications were reviewed preoperatively including but not limited to infection, organ damage, bleeding requiring transfusion of blood products with HIV and hepatitis acquisition, DVT, PE, pneumonia, laparotomy, fistula formation, urinary retention and  postoperative catheterization, return to the operating room, erosion of the polypropylene mesh sling into the vagina, urethra or bladder, persistent or recurrent stress incontinence, painful intercourse, vulvar pain, fistula formation.  The patient  states she understands and agrees and consent was signed on the chart.  FINDINGS:  There were a veil of omental adhesions from just superior to the  umbilicus to the right abdominal sidewall.  In the pelvis, the uterus had some small fibroids.  Status post tubal ligation with Filshie clips bilaterally.  Ovaries were normal  and the anterior and posterior cul-de-sacs were normal.  DESCRIPTION OF PROCEDURE:  The patient was taken to the operating room where she was identified, placed in the dorsal supine position and general anesthesia was induced via endotracheal intubation.  She was placed in the dorsal lithotomy position.   Timeout was undertaken.  She was prepped with ChloraPrep, vaginally with Hibiclens, bladder straight catheterized.  A single-tooth tenaculum was placed on the anterior cervical lip and an acorn tenaculum was placed in the cervical canal to be used as a  Financial trader.  After a 3 minute drying time, she was draped in a sterile fashion.  The infraumbilical and suprapubic areas were injected with approximately 5 mL of 0.5% plain Marcaine.  A small infraumbilical incision was made.  Disposable Veress needle  was placed on first attempt without difficulty and a good syringe and drop tests were noted.  Two L of gas were insufflated under low pressure with good tympany in the right upper quadrant.  Veress needle was removed and a 10 XL bladeless disposable  trocar sleeve was placed using direct visualization with the diagnostic scope.  The operative scope was then used.  A small suprapubic incision was made in the midline and a 5 mm disposable trocar sleeve was placed under direct visualization without  difficulty.  The above findings were noted.  Then, using the EnSeal bipolar cutter cutting instrument, the right infundibulopelvic ligament was taken down.  This was carried across proximally, which was then used to come across the round ligament and  down to the level of vesicouterine peritoneum.  Hemostasis was maintained.  Similar procedure was carried on the left side.  Vesicouterine peritoneum was taken down in the midline.  After  assuring  hemostasis, instruments were removed, suprapubic trocar  sleeve was removed and attention was turned to the vagina.  Posterior cul-de-sac was entered sharply with scissors and the cervix was circumscribed with unipolar cautery.  Mucosa was advanced sharply and bluntly.  Then, using the LigaSure handheld  bipolar cautery cutting instrument, progressive bites were taken of the uterosacral ligaments, bladder pillars, cardinal ligaments and the vessels.  Anterior cul-de-sac was entered.  The fundus with tubes and ovaries was delivered posteriorly and the  remaining pedicles were taken down under good visualization.  Suture will be 0 Monocryl unless otherwise designated.  Uterosacral ligaments were plicated to the cuff bilaterally.  Additional suture was placed at the 3 and 9 o'clock positions to assure  hemostasis.  Uterosacral ligaments were then plicated in the midline with 2 sutures.  The posterior half of the vaginal cuff was closed with interrupted figure-of-eights.  The anterior repair was then done.  The anterior vaginal mucosa was infiltrated  with 0.5% lidocaine with epinephrine.  Mucosa was then taken down in the midline to a point about 3-4 cm below the level of the urethral meatus.  Just prior to this, a Foley catheter had been placed.  Bladder had been drained and the Foley was left in  place.  Dissection was carried out widely, dissecting the vaginal mucosa from the underlying bladder.  The bladder was then reduced first with a pursestring suture and then with interrupted figure-of-eights, which offered excellent support.  Excess  vaginal mucosa was trimmed.  The anterior vaginal cuff was then closed with figure-of-eights and the anterior vaginal mucosa was closed in a running locking fashion with a 2-0 Vicryl suture.  The location for the inguinal incisions bilaterally was a  marked after careful palpation and these were injected with additional local.  An additional local was then  injected in the suburethral vaginal mucosa bilaterally.  A small midline incision was made below the mid urethra.  Dissection was carried out  bilaterally with Metzenbaum scissors.  This allowed passage of the examining finger submucosally on both sides.  Then, using the Obtryx Halo needles, these were placed through the inguinal stab incisions and carried around and exited through the  suburethral incision with the passage of the needle carefully guided with the examining finger.  This was done bilaterally.  Foley catheter was then removed and cystoscopy was carried out with a 30-degree cystoscope.  A 360-degree inspection revealed no  foreign bodies, the bladder to be intact, a good jet of urine from the ureters bilaterally and no evidence of perforation of the needles.  The Foley was then replaced.  Bladder was decompressed and the polypropylene mesh sling was then placed on the  needle tips bilaterally, which were then withdrawn on either side.  The sheath was then released while carefully supporting the sling in the midline to avoid excess traction on the tissues.  Examination revealed the sling to be in place, lying flat, with  approximately 1 mm of space between the sling and the urethral tissues.  The suburethral incision was closed in a running locking fashion with 2-0 Vicryl suture.  Excess sling was trimmed at the level of the skin in the inguinal incisions bilaterally  and these were both closed with glue.  Posterior repair was then carried out by removing a shallow wedge of tissue over the posterior perineal body at the introitus.  Posterior vaginal mucosa was also injected with 0.5% lidocaine with epinephrine.   Mucosa was taken down in the  midline to a point about two-thirds of the way up the posterior vaginal wall.  This was dissected bilaterally.  It was felt at this point that there was good support of the apex and a sacrospinous ligament suspension was not  necessary.  The rectovaginal  fascia was then reapproximated with interrupted 0 Monocryl suture.  This offered excellent support.  Excess tissue was then trimmed and the posterior vaginal mucosa was closed in a running locking fashion as well.  There was  a single bleeder at the apex of the posterior vaginal closure that was securely ligated with figure-of-eights and observation revealed good hemostasis.  Vaginal packing with Estrace cream was then placed.  Attention was returned to the abdomen.   Pneumoperitoneum was reintroduced.  Careful inspection revealed minor oozing at some peritoneal edges on the cuff, which was easily controlled with bipolar cautery.  Lavage was carried out.  Observation revealed excellent hemostasis.  Ten mL of 0.5%  plain Marcaine was then instilled into the peritoneal cavity.  Suprapubic trocar sleeve was removed.  Pneumoperitoneum was reduced and the umbilical trocar sleeve was removed.  Umbilical incision was closed with a 0 Vicryl in the subcutaneous layer under  careful observation and the skin on both incisions was closed with 2-0 Vicryl and Dermabond.  All counts were correct.  The patient is awakened and taken to the recovery room in stable condition.  VN/NUANCE  D:01/29/2020 T:01/29/2020 JOB:[id number]

## 2020-01-29 NOTE — Transfer of Care (Signed)
Immediate Anesthesia Transfer of Care Note  Patient: Princetta R Kolk  Procedure(s) Performed: LAPAROSCOPIC ASSISTED VAGINAL HYSTERECTOMY WITH SALPINGO OOPHORECTOMY (Bilateral Abdomen) ANTERIOR (CYSTOCELE) AND POSTERIOR REPAIR (RECTOCELE) SACROSPINOUS LIGAMENT SUSPENSION (N/A Vagina ) Transobturator sling (N/A Vagina )  Patient Location: PACU  Anesthesia Type:General  Level of Consciousness: awake, alert  and oriented  Airway & Oxygen Therapy: Patient Spontanous Breathing and Patient connected to nasal cannula oxygen  Post-op Assessment: Report given to RN and Post -op Vital signs reviewed and stable  Post vital signs: Reviewed and stable  Last Vitals:  Vitals Value Taken Time  BP 114/70 01/29/20 1035  Temp 36.8 C 01/29/20 1035  Pulse 83 01/29/20 1038  Resp 11 01/29/20 1038  SpO2 100 % 01/29/20 1038  Vitals shown include unvalidated device data.  Last Pain:  Vitals:   01/29/20 0621  TempSrc: Oral  PainSc: 0-No pain      Patients Stated Pain Goal: 5 (01/29/20 2248)  Complications: No complications documented.

## 2020-01-29 NOTE — Progress Notes (Signed)
01/29/2020  10:25 AM  PATIENT:  Claudia Hawkins  49 y.o. female  PRE-OPERATIVE DIAGNOSIS:  prolapse, SUI  POST-OPERATIVE DIAGNOSIS:  prolapse, SUI  PROCEDURE:  LAVH/BSO/A&P repair/TOT  SURGEON:  Surgeon(s) and Role:    * Harold Hedge, MD - Primary    * Richarda Overlie, MD - Assisting  PHYSICIAN ASSISTANT:   ASSISTANTS: Holland MD   ANESTHESIA:   general  EBL:  500 mL   BLOOD ADMINISTERED:none  DRAINS: Urinary Catheter (Foley)   LOCAL MEDICATIONS USED:  MARCAINE    and LIDOCAINE   SPECIMEN:  Source of Specimen:  uterus, bilateral tubes/ovaries  DISPOSITION OF SPECIMEN:  PATHOLOGY  COUNTS:  YES  TOURNIQUET:  * No tourniquets in log *  DICTATION: .Other Dictation: Dictation Number  [id number]  PLAN OF CARE: Admit for overnight observation  PATIENT DISPOSITION:  PACU - hemodynamically stable.   Delay start of Pharmacological VTE agent (>24hrs) due to surgical blood loss or risk of bleeding: not applicable

## 2020-01-29 NOTE — Anesthesia Procedure Notes (Signed)
Procedure Name: Intubation Date/Time: 01/29/2020 7:41 AM Performed by: Rogers Blocker, CRNA Patient Re-evaluated:Patient Re-evaluated prior to induction Oxygen Delivery Method: Circle system utilized Preoxygenation: Pre-oxygenation with 100% oxygen Induction Type: IV induction Ventilation: Mask ventilation without difficulty Laryngoscope Size: Mac and 3 Grade View: Grade I Tube type: Oral Tube size: 7.0 mm Number of attempts: 1 Airway Equipment and Method: Stylet Placement Confirmation: ETT inserted through vocal cords under direct vision,  positive ETCO2,  CO2 detector and breath sounds checked- equal and bilateral Secured at: 21 cm Tube secured with: Tape Dental Injury: Teeth and Oropharynx as per pre-operative assessment

## 2020-01-29 NOTE — Progress Notes (Signed)
Low back pain and low abd pain both about a 4/10  VSS afeb UO clear  Abdomen soft  LE PaS on  A/P: stable         Will use ibuprofen, tylenol, robaxin, hydrocodone (she can use this without side effect) prn         Heat to her back prn         Foley and packing out at 5 am

## 2020-01-29 NOTE — Progress Notes (Signed)
No changes to H&P per patient history Reviewed with patient procedure-LAVH/BSO/A&P repair/possible SSLS/TOT All questions answered Allergies, neosporin, oxycontin, bactroban, benzalkonium, sulfa She states she understands and agrees

## 2020-01-30 ENCOUNTER — Encounter (HOSPITAL_BASED_OUTPATIENT_CLINIC_OR_DEPARTMENT_OTHER): Payer: Self-pay | Admitting: Obstetrics and Gynecology

## 2020-01-30 DIAGNOSIS — N819 Female genital prolapse, unspecified: Secondary | ICD-10-CM | POA: Diagnosis not present

## 2020-01-30 LAB — CBC
HCT: 34 % — ABNORMAL LOW (ref 36.0–46.0)
Hemoglobin: 11.7 g/dL — ABNORMAL LOW (ref 12.0–15.0)
MCH: 31.1 pg (ref 26.0–34.0)
MCHC: 34.4 g/dL (ref 30.0–36.0)
MCV: 90.4 fL (ref 80.0–100.0)
Platelets: 230 10*3/uL (ref 150–400)
RBC: 3.76 MIL/uL — ABNORMAL LOW (ref 3.87–5.11)
RDW: 12.3 % (ref 11.5–15.5)
WBC: 11.5 10*3/uL — ABNORMAL HIGH (ref 4.0–10.5)
nRBC: 0 % (ref 0.0–0.2)

## 2020-01-30 LAB — SAR COV2 SEROLOGY (COVID19)AB(IGG),IA: SARS-CoV-2 Ab, IgG: REACTIVE — AB

## 2020-01-30 MED ORDER — METHOCARBAMOL 500 MG PO TABS
ORAL_TABLET | ORAL | Status: AC
  Filled 2020-01-30: qty 1

## 2020-01-30 MED ORDER — HYDROCODONE-ACETAMINOPHEN 5-325 MG PO TABS
ORAL_TABLET | ORAL | Status: AC
  Filled 2020-01-30: qty 2

## 2020-01-30 MED ORDER — METHOCARBAMOL 500 MG PO TABS: 500.0000 mg | ORAL_TABLET | Freq: Four times a day (QID) | ORAL | 0 refills | Status: DC | PRN

## 2020-01-30 MED ORDER — ACETAMINOPHEN 325 MG PO TABS: 650.0000 mg | ORAL_TABLET | Freq: Four times a day (QID) | ORAL | 0 refills | Status: DC | PRN

## 2020-01-30 MED ORDER — HYDROCODONE-ACETAMINOPHEN 5-325 MG PO TABS
1.0000 | ORAL_TABLET | Freq: Four times a day (QID) | ORAL | 0 refills | Status: DC | PRN
Start: 2020-01-30 — End: 2020-03-18

## 2020-01-30 MED ORDER — IBUPROFEN 200 MG PO TABS
ORAL_TABLET | ORAL | Status: AC
  Filled 2020-01-30: qty 3

## 2020-01-30 MED ORDER — IBUPROFEN 600 MG PO TABS: 600.0000 mg | ORAL_TABLET | Freq: Four times a day (QID) | ORAL | 0 refills | Status: DC | PRN

## 2020-01-30 NOTE — Discharge Summary (Signed)
Physician Discharge Summary  Patient ID: Claudia Hawkins MRN: [id number] DOB/AGE: 1971/04/12 49 y.o.  Admit date: 01/29/2020 Discharge date: 01/30/2020  Admission Diagnoses:Pelvic prolapse Urinary incontinence  Discharge Diagnoses:  Active Problems:   Pelvic prolapse Urinary incontinence  Discharged Condition: good  Hospital Course: Taken to OR. Post operatively had resumption of bowel function and tolerated regular diet. Vaginal pack and foley removed POD #1. Post void residual volumes steadily declined from about 320 to 120 ml with good volume voids. Good pain control.  Consults: None  Significant Diagnostic Studies: labs:  Results for orders placed or performed during the hospital encounter of 01/29/20 (from the past 24 hour(s))  CBC     Status: Abnormal   Collection Time: 01/30/20  6:08 AM  Result Value Ref Range   WBC 11.5 (H) 4.0 - 10.5 K/uL   RBC 3.76 (L) 3.87 - 5.11 MIL/uL   Hemoglobin 11.7 (L) 12.0 - 15.0 g/dL   HCT 02.5 (L) 36 - 46 %   MCV 90.4 80.0 - 100.0 fL   MCH 31.1 26.0 - 34.0 pg   MCHC 34.4 30.0 - 36.0 g/dL   RDW 42.7 06.2 - 37.6 %   Platelets 230 150 - 400 K/uL   nRBC 0.0 0.0 - 0.2 %    Treatments: surgery: LAVH/BSO/A&P repair/TOT  Discharge Exam: Blood pressure (!) 111/56, pulse 63, temperature 98.4 F (36.9 C), resp. rate 16, height 5\' 3"  (1.6 m), weight 62.6 kg, SpO2 100 %. General appearance: alert, cooperative and no distress GI: soft, non-tender; bowel sounds normal; no masses,  no organomegaly  Disposition: Discharge disposition: 01-Home or Self Care       Discharge Instructions    Discharge patient   Complete by: As directed    Discharge disposition: 01-Home or Self Care   Discharge patient date: 01/30/2020     Allergies as of 01/30/2020      Reactions   Bactroban [mupirocin] Itching, Rash   At application site   Benzalkonium Chloride Rash   Neosporin [bacitracin-polymyxin B] Itching, Rash   At application site   Oxycontin  [oxycodone] Hives, Itching, Rash   Sulfa Antibiotics Hives, Diarrhea, Nausea And Vomiting, Rash      Medication List    STOP taking these medications   ESTROVEN COMPLETE PO     TAKE these medications   acetaminophen 325 MG tablet Commonly known as: TYLENOL Take 2 tablets (650 mg total) by mouth every 6 (six) hours as needed for mild pain (temperature > 101.5.).   albuterol 108 (90 Base) MCG/ACT inhaler Commonly known as: VENTOLIN HFA Inhale 1-2 puffs into the lungs every 6 (six) hours as needed for wheezing or shortness of breath.   estradiol-norethindrone 1-0.5 MG tablet Commonly known as: ACTIVELLA Take 1 tablet by mouth daily.   HYDROcodone-acetaminophen 5-325 MG tablet Commonly known as: NORCO/VICODIN Take 1-2 tablets by mouth every 6 (six) hours as needed for severe pain.   ibuprofen 600 MG tablet Commonly known as: ADVIL Take 1 tablet (600 mg total) by mouth every 6 (six) hours as needed for moderate pain.   methocarbamol 500 MG tablet Commonly known as: ROBAXIN Take 1 tablet (500 mg total) by mouth every 6 (six) hours as needed for muscle spasms.   montelukast 10 MG tablet Commonly known as: SINGULAIR Take 10 mg by mouth at bedtime.   propranolol 10 MG tablet Commonly known as: INDERAL Take 1 tablet (10 mg total) by mouth 3 (three) times daily as needed. What changed: reasons to take  this   Vyvanse 60 MG capsule Generic drug: lisdexamfetamine Take 60 mg by mouth daily.        Signed: Roselle Locus II 01/30/2020, 8:10 AM

## 2020-01-30 NOTE — Discharge Instructions (Signed)
Anterior and Posterior Colporrhaphy and Sling Procedure, Care After This sheet gives you information about how to care for yourself after your procedure. Your health care provider may also give you more specific instructions. If you have problems or questions, contact your health care provider. What can I expect after the procedure? After the procedure, it is common to have:  Pain in the surgical area.  Vaginal discharge. You will need to use a sanitary pad during this time.  Fatigue. Follow these instructions at home: Incision care   Follow instructions from your health care provider about how to take care of your incision. Make sure you: ? Wash your hands with soap and water before touching the incision area. If soap and water are not available, use hand sanitizer. ? Clean your incision as told by your health care provider. ? Leave stitches (sutures), skin glue, or adhesive strips in place. These skin closures may need to stay in place for 2 weeks or longer. If adhesive strip edges start to loosen and curl up, you may trim the loose edges. Do not remove adhesive strips completely unless your health care provider tells you to do that.  Check your incision area every day for signs of infection. Check for: ? Redness, swelling, or pain. ? Fluid or blood. ? Warmth. ? Pus or a bad smell.  Check your incision every day to make sure the incision area is not separating or opening.  Do not take baths, swim, or use a hot tub until your health care provider approves. You may shower.  Keep the area between your vagina and rectum (perineal area) clean and dry. Make sure you clean the area after each bowel movement and each time you urinate.  Ask your health care provider if you can take a sitz bath or sit in a tub of clean, warm water. Activity  Do gentle, daily activity as told by your health care provider. You may be told to take short walks every day and go farther each time. Ask your health  care provider what activities are safe for you.  Limit stair climbing to once or twice a day in the first week, then slowly increase this activity.  Do not lift anything that is heavier than 10 lbs. (4.5 kg), or the limit that your health care provider tells you, until he or she says that it is safe. Avoid pushing or pulling motions.  Avoid standing for long periods of time.  Do not douche, use tampons, or have sex until your health care provider says it is okay.  Do not drive or use heavy machinery while taking prescription pain medicine. To prevent constipation  To prevent or treat constipation while you are taking prescription pain medicine, your health care provider may recommend that you: ? Take over-the-counter or prescription medicines. ? Eat foods that are high in fiber, such as fresh fruits and vegetables, whole grains, and beans. ? Drink enough fluid to keep your urine clear or pale yellow. ? Limit foods that are high in fat and processed sugars, such as fried and sweet foods. General instructions  You may be instructed to do pelvic floor exercises (kegels) as told by your health care provider.  Take over-the-counter and prescription medicines only as told by your health care provider.  Keep all follow-up visits as told by your health care provider. This is important. Contact a health care provider if:  Medicine does not help your pain.  You have frequent or urgent urination, or  you are unable to completely empty your bladder.  You feel a burning sensation when urinating.  You have fluid or blood coming from your incision.  You have pus or a bad smell coming from the incision.  Your incision feels warm to the touch.  You have redness, swelling, or pain around your incision. Get help right away if:  You have a fever or chills.  Your incision separates or opens.  You cannot urinate.  You have trouble breathing. Summary  After the procedure, it is common to  have pain, fatigue, and discharge from the vagina.  Keep the area between your vagina and rectum (perineal area) clean and dry. Make sure you clean the area after each bowel movement and each time you urinate.  Follow instructions from your health care provider on any activity restrictions after the procedure. This information is not intended to replace advice given to you by your health care provider. Make sure you discuss any questions you have with your health care provider. Document Revised: 04/09/2017 Document Reviewed: 04/27/2016 Elsevier Patient Education  2020 [employer] Laparoscopically Assisted Vaginal Hysterectomy, Care After This sheet gives you information about how to care for yourself after your procedure. Your health care provider may also give you more specific instructions. If you have problems or questions, contact your health care provider. What can I expect after the procedure? After the procedure, it is common to have:  Soreness and numbness in your incision areas.  Abdominal pain. You will be given pain medicine to control it.  Vaginal bleeding and discharge. You will need to use a sanitary napkin after this procedure.  Sore throat from the breathing tube that was inserted during surgery. Follow these instructions at home: Medicines  Take over-the-counter and prescription medicines only as told by your health care provider.  Do not take aspirin or ibuprofen. These medicines can cause bleeding.  Do not drive or use heavy machinery while taking prescription pain medicine.  Do not drive for 24 hours if you were given a medicine to help you relax (sedative) during the procedure. Incision care   Follow instructions from your health care provider about how to take care of your incisions. Make sure you: ? Wash your hands with soap and water before you change your bandage (dressing). If soap and water are not available, use hand sanitizer. ? Change your dressing  as told by your health care provider. ? Leave stitches (sutures), skin glue, or adhesive strips in place. These skin closures may need to stay in place for 2 weeks or longer. If adhesive strip edges start to loosen and curl up, you may trim the loose edges. Do not remove adhesive strips completely unless your health care provider tells you to do that.  Check your incision area every day for signs of infection. Check for: ? Redness, swelling, or pain. ? Fluid or blood. ? Warmth. ? Pus or a bad smell. Activity  Get regular exercise as told by your health care provider. You may be told to take short walks every day and go farther each time.  Return to your normal activities as told by your health care provider. Ask your health care provider what activities are safe for you.  Do not douche, use tampons, or have sexual intercourse for at least 6 weeks, or until your health care provider gives you permission.  Do not lift anything that is heavier than 10 lb (4.5 kg), or the limit that your health care provider tells  you, until he or she says that it is safe. General instructions  Do not take baths, swim, or use a hot tub until your health care provider approves. Take showers instead of baths.  Do not drive for 24 hours if you received a sedative.  Do not drive or operate heavy machinery while taking prescription pain medicine.  To prevent or treat constipation while you are taking prescription pain medicine, your health care provider may recommend that you: ? Drink enough fluid to keep your urine clear or pale yellow. ? Take over-the-counter or prescription medicines. ? Eat foods that are high in fiber, such as fresh fruits and vegetables, whole grains, and beans. ? Limit foods that are high in fat and processed sugars, such as fried and sweet foods.  Keep all follow-up visits as told by your health care provider. This is important. Contact a health care provider if:  You have signs of  infection, such as: ? Redness, swelling, or pain around your incision sites. ? Fluid or blood coming from an incision. ? An incision that feels warm to the touch. ? Pus or a bad smell coming from an incision.  Your incision breaks open.  Your pain medicine is not helping.  You feel dizzy or light-headed.  You have pain or bleeding when you urinate.  You have persistent nausea and vomiting.  You have blood, pus, or a bad-smelling discharge from your vagina. Get help right away if:  You have a fever.  You have severe abdominal pain.  You have chest pain.  You have shortness of breath.  You faint.  You have pain, swelling, or redness in your leg.  You have heavy bleeding from your vagina. Summary  After the procedure, it is common to have abdominal pain and vaginal bleeding.  You should not drive or lift heavy objects until your health care provider says that it is safe.  Contact your health care provider if you have any symptoms of infection, excessive vaginal bleeding, nausea, vomiting, or shortness of breath. This information is not intended to replace advice given to you by your health care provider. Make sure you discuss any questions you have with your health care provider. Document Revised: 04/09/2017 Document Reviewed: 06/23/2016 Elsevier Patient Education  2020 [employer].

## 2020-01-30 NOTE — Progress Notes (Signed)
Pt voided 100cc of pink tinge urine and than was bladder scanned for residual of 229. Dr. Henderson Cloud called updated with void of 100 and residual 229.  Orders rec to in and out cath.   Went into pt room to explain plan and she said she has to void, assisted to  br and voided 200 cc bladder scan for residual was 159.  Dr Henderson Cloud on way to surgery center and wait till he gets here to update him

## 2020-01-30 NOTE — Progress Notes (Signed)
Pt voided 200 cc bladder scanned for residual of 108 cc. Dr. Dellie Catholic and updated on urine and residual and pt can go home and will put in orders

## 2020-01-30 NOTE — Progress Notes (Signed)
Vaginal packing removed with moderate amt of sanguinous drainage noted.  tol procedure well.  Foley cath removed tol procedure well.  Instructed to call nurse when she needs to go to [facility].

## 2020-02-01 LAB — SURGICAL PATHOLOGY

## 2020-03-18 ENCOUNTER — Telehealth (INDEPENDENT_AMBULATORY_CARE_PROVIDER_SITE_OTHER): Payer: Self-pay | Admitting: Gastroenterology

## 2020-03-18 ENCOUNTER — Other Ambulatory Visit: Payer: Self-pay | Admitting: Gastroenterology

## 2020-03-18 ENCOUNTER — Other Ambulatory Visit: Payer: Self-pay

## 2020-03-18 DIAGNOSIS — Z1211 Encounter for screening for malignant neoplasm of colon: Secondary | ICD-10-CM

## 2020-03-18 MED ORDER — PEG 3350-KCL-NA BICARB-NACL 420 G PO SOLR: 4000.0000 mL | Freq: Once | ORAL | 0 refills | Status: AC

## 2020-03-18 NOTE — Progress Notes (Signed)
Gastroenterology Pre-Procedure Review  Request Date: Tuesday 04/02/20 Requesting Physician: Dr. Servando Snare  PATIENT REVIEW QUESTIONS: The patient responded to the following health history questions as indicated:    1. Are you having any GI issues? no 2. Do you have a personal history of Polyps? no 3. Do you have a family history of Colon Cancer or Polyps? yes (family history of colon polyps unsure of which family member) 4. Diabetes Mellitus? no 5. Joint replacements in the past 12 months?no 6. Major health problems in the past 3 months?yes (September 20th-Radical hysterectomy with interior and posterior repairs) 7. Any artificial heart valves, MVP, or defibrillator?no    MEDICATIONS & ALLERGIES:    Patient reports the following regarding taking any anticoagulation/antiplatelet therapy:   Plavix, Coumadin, Eliquis, Xarelto, Lovenox, Pradaxa, Brilinta, or Effient? no Aspirin? no  Patient confirms/reports the following medications:  Current Outpatient Medications  Medication Sig Dispense Refill  . estradiol-norethindrone (ACTIVELLA) 1-0.5 MG tablet Take 1 tablet by mouth daily.    Marland Kitchen EVENING PRIMROSE OIL PO Take by mouth daily.    . fluticasone (FLONASE) 50 MCG/ACT nasal spray Place into both nostrils daily.    Marland Kitchen lisdexamfetamine (VYVANSE) 60 MG capsule Take 60 mg by mouth daily.     . Melatonin 10 MG TABS Take by mouth. At bedtime    . methocarbamol (ROBAXIN) 500 MG tablet Take 1 tablet (500 mg total) by mouth every 6 (six) hours as needed for muscle spasms. 30 tablet 0  . montelukast (SINGULAIR) 10 MG tablet Take 10 mg by mouth at bedtime.   0  . OVER THE COUNTER MEDICATION Magnesium citrate gummy 2 po daily    . propranolol (INDERAL) 10 MG tablet Take 1 tablet (10 mg total) by mouth 3 (three) times daily as needed. (Patient taking differently: Take 10 mg by mouth 3 (three) times daily as needed (heart arrhythmia). ) 90 tablet 4  . albuterol (VENTOLIN HFA) 108 (90 Base) MCG/ACT inhaler  Inhale 1-2 puffs into the lungs every 6 (six) hours as needed for wheezing or shortness of breath. (Patient not taking: Reported on 03/18/2020)    . ibuprofen (ADVIL) 600 MG tablet Take 1 tablet (600 mg total) by mouth every 6 (six) hours as needed for moderate pain. (Patient not taking: Reported on 03/18/2020) 60 tablet 0  . polyethylene glycol-electrolytes (NULYTELY) 420 g solution Take 4,000 mLs by mouth once for 1 dose. 4000 mL 0   No current facility-administered medications for this visit.    Patient confirms/reports the following allergies:  Allergies  Allergen Reactions  . Bactroban [Mupirocin] Itching and Rash    At application site   . Benzalkonium Chloride Rash  . Neosporin [Bacitracin-Polymyxin B] Itching and Rash    At application site  . Oxycontin [Oxycodone] Hives, Itching and Rash  . Sulfa Antibiotics Hives, Diarrhea, Nausea And Vomiting and Rash  . Wound Dressing Adhesive Itching and Rash    No orders of the defined types were placed in this encounter.   AUTHORIZATION INFORMATION Primary Insurance: 1D#: Group #:  Secondary Insurance: 1D#: Group #:  SCHEDULE INFORMATION: Date: Tuesday 04/02/20 Time: Location:[facility]

## 2020-03-22 ENCOUNTER — Other Ambulatory Visit: Payer: Self-pay

## 2020-03-22 MED ORDER — PEG 3350-KCL-NA BICARB-NACL 420 G PO SOLR: 4000.0000 mL | Freq: Once | ORAL | 0 refills | Status: AC

## 2020-03-26 ENCOUNTER — Telehealth: Payer: Self-pay | Admitting: Gastroenterology

## 2020-03-26 ENCOUNTER — Other Ambulatory Visit: Payer: Self-pay

## 2020-03-26 DIAGNOSIS — Z1211 Encounter for screening for malignant neoplasm of colon: Secondary | ICD-10-CM

## 2020-03-26 NOTE — Telephone Encounter (Signed)
Patient calling to resch her procedure with Dr. Servando Snare from 11.23.21. Pt did not give reason as to why the change. Pt had screening call on 11.8.21. Please call pt to resch.

## 2020-03-26 NOTE — Telephone Encounter (Signed)
Per CMA Marcelino Duster, she called pt and rescheduled to 05/07/20

## 2020-04-29 ENCOUNTER — Telehealth: Payer: Self-pay | Admitting: Gastroenterology

## 2020-04-29 NOTE — Telephone Encounter (Signed)
Contacted patient to reschedule but no answer. Endo unit has been notified patient would like to cancel procedure.

## 2020-04-29 NOTE — Telephone Encounter (Signed)
Patient calling to cancel procedure with Dr. Servando Snare on 12.28.21. Patient states husband is having to do emergency surgery and she has to take care of that first. Pt will cb to resch first of year. Pt had phone screening on 11.8.21.

## 2020-05-07 ENCOUNTER — Encounter: Admission: RE | Payer: Self-pay | Source: Home / Self Care

## 2020-05-07 ENCOUNTER — Ambulatory Visit
Admission: RE | Admit: 2020-05-07 | Payer: BC Managed Care – PPO | Source: Home / Self Care | Admitting: Gastroenterology

## 2020-05-07 SURGERY — COLONOSCOPY WITH PROPOFOL
Anesthesia: General

## 2020-10-28 ENCOUNTER — Telehealth: Payer: Self-pay | Admitting: Gastroenterology

## 2020-10-28 ENCOUNTER — Other Ambulatory Visit: Payer: Self-pay

## 2020-10-28 DIAGNOSIS — Z1211 Encounter for screening for malignant neoplasm of colon: Secondary | ICD-10-CM

## 2020-10-28 NOTE — Telephone Encounter (Signed)
Needs to reschedule procedue

## 2020-10-28 NOTE — Telephone Encounter (Signed)
Scheduled procedure to 11/18/2020. Put orders in the chart and sent instructions to [facility]

## 2020-10-28 NOTE — Telephone Encounter (Signed)
Called and left a message for call back  

## 2020-11-04 ENCOUNTER — Encounter: Payer: Self-pay | Admitting: Gastroenterology

## 2020-11-05 NOTE — Anesthesia Preprocedure Evaluation (Addendum)
Anesthesia Evaluation  Patient identified by MRN, date of birth, ID band Patient awake    Reviewed: Allergy & Precautions, NPO status , Patient's Chart, lab work & pertinent test results  History of Anesthesia Complications Negative for: history of anesthetic complications  Airway Mallampati: I   Neck ROM: Full    Dental no notable dental hx.    Pulmonary asthma ,    Pulmonary exam normal breath sounds clear to auscultation       Cardiovascular Exercise Tolerance: Good negative cardio ROS Normal cardiovascular exam Rhythm:Regular Rate:Normal  ECG 01/17/20: NSR with sinus arrhythmia   Neuro/Psych PSYCHIATRIC DISORDERS (ADHD) negative neurological ROS     GI/Hepatic negative GI ROS,   Endo/Other  negative endocrine ROS  Renal/GU Renal disease (nephrolithiasis)     Musculoskeletal   Abdominal   Peds  Hematology negative hematology ROS (+)   Anesthesia Other Findings   Reproductive/Obstetrics                            Anesthesia Physical Anesthesia Plan  ASA: 2  Anesthesia Plan: General   Post-op Pain Management:    Induction: Intravenous  PONV Risk Score and Plan: 3 and Propofol infusion, TIVA and Treatment may vary due to age or medical condition  Airway Management Planned: Natural Airway  Additional Equipment:   Intra-op Plan:   Post-operative Plan:   Informed Consent: I have reviewed the patients History and Physical, chart, labs and discussed the procedure including the risks, benefits and alternatives for the proposed anesthesia with the patient or authorized representative who has indicated his/her understanding and acceptance.       Plan Discussed with: CRNA  Anesthesia Plan Comments:        Anesthesia Quick Evaluation

## 2020-11-18 ENCOUNTER — Ambulatory Visit: Payer: BC Managed Care – PPO | Admitting: Anesthesiology

## 2020-11-18 ENCOUNTER — Encounter
Admission: RE | Disposition: A | Discharge status: Home / Self Care | Payer: Self-pay | Source: Home / Self Care | Attending: Gastroenterology

## 2020-11-18 ENCOUNTER — Other Ambulatory Visit: Payer: Self-pay

## 2020-11-18 ENCOUNTER — Ambulatory Visit
Admission: RE | Admit: 2020-11-18 | Discharge: 2020-11-18 | Disposition: A | Discharge status: Home / Self Care | Payer: BC Managed Care – PPO | Attending: Gastroenterology | Admitting: Gastroenterology

## 2020-11-18 DIAGNOSIS — Z8616 Personal history of COVID-19: Secondary | ICD-10-CM | POA: Insufficient documentation

## 2020-11-18 DIAGNOSIS — Z881 Allergy status to other antibiotic agents status: Secondary | ICD-10-CM | POA: Diagnosis not present

## 2020-11-18 DIAGNOSIS — Z888 Allergy status to other drugs, medicaments and biological substances status: Secondary | ICD-10-CM | POA: Diagnosis not present

## 2020-11-18 DIAGNOSIS — K640 First degree hemorrhoids: Secondary | ICD-10-CM | POA: Diagnosis not present

## 2020-11-18 DIAGNOSIS — Z79899 Other long term (current) drug therapy: Secondary | ICD-10-CM | POA: Insufficient documentation

## 2020-11-18 DIAGNOSIS — Z1211 Encounter for screening for malignant neoplasm of colon: Secondary | ICD-10-CM | POA: Diagnosis present

## 2020-11-18 DIAGNOSIS — Z885 Allergy status to narcotic agent status: Secondary | ICD-10-CM | POA: Diagnosis not present

## 2020-11-18 DIAGNOSIS — Z882 Allergy status to sulfonamides status: Secondary | ICD-10-CM | POA: Diagnosis not present

## 2020-11-18 HISTORY — PX: COLONOSCOPY WITH PROPOFOL: SHX5780

## 2020-11-18 SURGERY — COLONOSCOPY WITH PROPOFOL
Anesthesia: General | Site: Rectum

## 2020-11-18 MED ORDER — PROPOFOL 10 MG/ML IV BOLUS
INTRAVENOUS | Status: DC | PRN
  Administered 2020-11-18: 60 mg via INTRAVENOUS
  Administered 2020-11-18: 70 mg via INTRAVENOUS
  Administered 2020-11-18: 40 mg via INTRAVENOUS

## 2020-11-18 MED ORDER — ACETAMINOPHEN 325 MG PO TABS: 650.0000 mg | ORAL_TABLET | Freq: Once | ORAL | Status: DC | PRN

## 2020-11-18 MED ORDER — SODIUM CHLORIDE 0.9 % IV SOLN: INTRAVENOUS | Status: DC

## 2020-11-18 MED ORDER — LACTATED RINGERS IV SOLN: INTRAVENOUS | Status: DC

## 2020-11-18 MED ORDER — ONDANSETRON HCL 4 MG/2ML IJ SOLN: 4.0000 mg | Freq: Once | INTRAMUSCULAR | Status: DC | PRN

## 2020-11-18 MED ORDER — STERILE WATER FOR IRRIGATION IR SOLN: Status: DC | PRN

## 2020-11-18 MED ORDER — ACETAMINOPHEN 160 MG/5ML PO SOLN: 325.0000 mg | ORAL | Status: DC | PRN

## 2020-11-18 SURGICAL SUPPLY — 6 items
GOWN CVR UNV OPN BCK APRN NK (MISCELLANEOUS) ×2 IMPLANT
GOWN ISOL THUMB LOOP REG UNIV (MISCELLANEOUS) ×4
KIT PRC NS LF DISP ENDO (KITS) ×1 IMPLANT
KIT PROCEDURE OLYMPUS (KITS) ×2
MANIFOLD NEPTUNE II (INSTRUMENTS) ×2 IMPLANT
WATER STERILE IRR 250ML POUR (IV SOLUTION) ×2 IMPLANT

## 2020-11-18 NOTE — Transfer of Care (Signed)
Immediate Anesthesia Transfer of Care Note  Patient: Claudia Hawkins  Procedure(s) Performed: COLONOSCOPY WITH PROPOFOL (Rectum)  Patient Location: PACU  Anesthesia Type: General  Level of Consciousness: awake, alert  and patient cooperative  Airway and Oxygen Therapy: Patient Spontanous Breathing and Patient connected to supplemental oxygen  Post-op Assessment: Post-op Vital signs reviewed, Patient's Cardiovascular Status Stable, Respiratory Function Stable, Patent Airway and No signs of Nausea or vomiting  Post-op Vital Signs: Reviewed and stable  Complications: No notable events documented.

## 2020-11-18 NOTE — Anesthesia Postprocedure Evaluation (Signed)
Anesthesia Post Note  Patient: Claudia Hawkins  Procedure(s) Performed: COLONOSCOPY WITH PROPOFOL (Rectum)     Patient location during evaluation: PACU Anesthesia Type: General Level of consciousness: awake and alert, oriented and patient cooperative Pain management: pain level controlled Vital Signs Assessment: post-procedure vital signs reviewed and stable Respiratory status: spontaneous breathing, nonlabored ventilation and respiratory function stable Cardiovascular status: blood pressure returned to baseline and stable Postop Assessment: adequate PO intake Anesthetic complications: no   No notable events documented.  Reed Breech

## 2020-11-18 NOTE — H&P (Signed)
Midge Minium, MD [facility] [address], [address] [place], [place] [address] Phone: [phone] Fax : [phone]  Primary Care Physician:  Blair Heys, MD Primary Gastroenterologist:  Dr. Servando Snare  Pre-Procedure History & Physical: HPI:  Claudia Hawkins is a 50 y.o. female is here for a screening colonoscopy.   Past Medical History:  Diagnosis Date   Adult ADHD    Allergic rhinitis    Asthma    very rarely uses albuterol    COVID-19 12/05/2019   Dysrhythmia    tachycardia    History of kidney stones     Past Surgical History:  Procedure Laterality Date   ANTERIOR AND POSTERIOR REPAIR N/A 01/29/2020   Procedure: ANTERIOR (CYSTOCELE) AND POSTERIOR REPAIR (RECTOCELE) SACROSPINOUS LIGAMENT SUSPENSION;  Surgeon: Harold Hedge, MD;  Location: [facility];  Service: Gynecology;  Laterality: N/A;   BLADDER SUSPENSION N/A 01/29/2020   Procedure: Transobturator sling;  Surgeon: Harold Hedge, MD;  Location: [facility]  Service: Gynecology;  Laterality: N/A;   LAPAROSCOPIC VAGINAL HYSTERECTOMY WITH SALPINGO OOPHORECTOMY Bilateral 01/29/2020   Procedure: LAPAROSCOPIC ASSISTED VAGINAL HYSTERECTOMY WITH SALPINGO OOPHORECTOMY;  Surgeon: Harold Hedge, MD;  Location: [facility];  Service: Gynecology;  Laterality: Bilateral;  need bed   right knee surgery      right wrist surgery      SMALL INTESTINE SURGERY     50 years old   torn meniscus right knee surgery      TUBAL LIGATION      Prior to Admission medications   Medication Sig Start Date End Date Taking? Authorizing Provider  estradiol-norethindrone (ACTIVELLA) 1-0.5 MG tablet Take 1 tablet by mouth daily. 12/18/19  Yes [provider]  EVENING PRIMROSE OIL PO Take by mouth daily.   Yes [provider]  fluticasone (FLONASE) 50 MCG/ACT nasal spray Place into both nostrils daily.   Yes [provider]  lisdexamfetamine (VYVANSE) 60 MG capsule Take 60 mg by mouth  daily.  08/19/14  Yes [provider]  Melatonin 10 MG TABS Take by mouth. At bedtime   Yes [provider]  methocarbamol (ROBAXIN) 500 MG tablet Take 1 tablet (500 mg total) by mouth every 6 (six) hours as needed for muscle spasms. 01/30/20  Yes Harold Hedge, MD  metoprolol succinate (TOPROL-XL) 25 MG 24 hr tablet Take 25 mg by mouth daily.   Yes [provider]  montelukast (SINGULAIR) 10 MG tablet Take 10 mg by mouth at bedtime.  01/09/15  Yes [provider]  OVER THE COUNTER MEDICATION Magnesium citrate gummy 2 po daily   Yes [provider]  ibuprofen (ADVIL) 600 MG tablet Take 1 tablet (600 mg total) by mouth every 6 (six) hours as needed for moderate pain. Patient not taking: Reported on 03/18/2020 01/30/20   Harold Hedge, MD    Allergies as of 10/28/2020 - Review Complete 03/18/2020  Allergen Reaction Noted   Bactroban [mupirocin] Itching and Rash 01/11/2020   Benzalkonium chloride Rash 08/30/2014   Neosporin [bacitracin-polymyxin b] Itching and Rash 01/11/2020   Oxycontin [oxycodone] Hives, Itching, and Rash 01/11/2020   Sulfa antibiotics Hives, Diarrhea, Nausea And Vomiting, and Rash 01/11/2020   Wound dressing adhesive Itching and Rash 03/14/2020    Family History  Problem Relation Age of Onset   Hypertension Father     Social History   Socioeconomic History   Marital status: Married    Spouse name: Not on file   Number of children: 3  Years of education: 64   Highest education level: Not on file  Occupational History   Not on file  Tobacco Use   Smoking status: Never   Smokeless tobacco: Never  Vaping Use   Vaping Use: Never used  Substance and Sexual Activity   Alcohol use: Yes    Comment: Social drinker    Drug use: No   Sexual activity: Not on file  Other Topics Concern   Not on file  Social History Narrative   Not on file   Social Determinants of Health   Financial Resource Strain: Not on file  Food  Insecurity: Not on file  Transportation Needs: Not on file  Physical Activity: Not on file  Stress: Not on file  Social Connections: Not on file  Intimate Partner Violence: Not on file    Review of Systems: See HPI, otherwise negative ROS  Physical Exam: BP 128/82   Pulse 74   Temp 97.6 F (36.4 C) (Temporal)   Ht 5\' 3"  (1.6 m)   Wt 68.5 kg   SpO2 99%   BMI 26.75 kg/m  General:   Alert,  pleasant and cooperative in NAD Head:  Normocephalic and atraumatic. Neck:  Supple; no masses or thyromegaly. Lungs:  Clear throughout to auscultation.    Heart:  Regular rate and rhythm. Abdomen:  Soft, nontender and nondistended. Normal bowel sounds, without guarding, and without rebound.   Neurologic:  Alert and  oriented x4;  grossly normal neurologically.  Impression/Plan: Claudia Hawkins is now here to undergo a screening colonoscopy.  Risks, benefits, and alternatives regarding colonoscopy have been reviewed with the patient.  Questions have been answered.  All parties agreeable.

## 2020-11-18 NOTE — Op Note (Signed)
[facility] Gastroenterology Patient Name: Claudia Hawkins Procedure Date: 11/18/2020 8:57 AM MRN: [id number] Account #: [id number] Date of Birth: 1970/08/20 Admit Type: Outpatient Age: 50 Room: [facility] OR ROOM 01 Gender: Female Note Status: Finalized Procedure:             Colonoscopy Indications:           Screening for colorectal malignant neoplasm Providers:             Midge Minium MD, MD Referring MD:          Blair Heys, MD (Referring MD) Medicines:             Propofol per Anesthesia Complications:         No immediate complications. Procedure:             Pre-Anesthesia Assessment:                        - Prior to the procedure, a History and Physical was                         performed, and patient medications and allergies were                         reviewed. The patient's tolerance of previous                         anesthesia was also reviewed. The risks and benefits                         of the procedure and the sedation options and risks                         were discussed with the patient. All questions were                         answered, and informed consent was obtained. Prior                         Anticoagulants: The patient has taken no previous                         anticoagulant or antiplatelet agents. ASA Grade                         Assessment: II - A patient with mild systemic disease.                         After reviewing the risks and benefits, the patient                         was deemed in satisfactory condition to undergo the                         procedure.                        After obtaining informed consent, the colonoscope was  passed under direct vision. Throughout the procedure,                         the patient's blood pressure, pulse, and oxygen                         saturations were monitored continuously. The was                         introduced through the anus and  advanced to the the                         cecum, identified by appendiceal orifice and ileocecal                         valve. The colonoscopy was performed without                         difficulty. The patient tolerated the procedure well.                         The quality of the bowel preparation was excellent. Findings:      The perianal and digital rectal examinations were normal.      Non-bleeding internal hemorrhoids were found during retroflexion. The       hemorrhoids were Grade I (internal hemorrhoids that do not prolapse).      The exam was otherwise without abnormality. Impression:            - Non-bleeding internal hemorrhoids.                        - The examination was otherwise normal.                        - No specimens collected. Recommendation:        - Discharge patient to home.                        - Resume previous diet.                        - Continue present medications.                        - Repeat colonoscopy in 10 years for screening                         purposes. Procedure Code(s):     --- Professional ---                        [phone], Colonoscopy, flexible; diagnostic, including                         collection of specimen(s) by brushing or washing, when                         performed (separate procedure) Diagnosis Code(s):     --- Professional ---  Z12.11, Encounter for screening for malignant neoplasm                         of colon CPT copyright 2019 [employer]. All rights reserved. The codes documented in this report are preliminary and upon coder review may  be revised to meet current compliance requirements. Midge Minium MD, MD 11/18/2020 9:29:38 AM This report has been signed electronically. Number of Addenda: 0 Note Initiated On: 11/18/2020 8:57 AM Scope Withdrawal Time: 0 hours 7 minutes 3 seconds  Total Procedure Duration: 0 hours 14 minutes 37 seconds  Estimated Blood Loss:   Estimated blood loss: none.      [facility]

## 2020-11-20 ENCOUNTER — Encounter: Payer: Self-pay | Admitting: Gastroenterology

## 2021-06-18 ENCOUNTER — Other Ambulatory Visit: Payer: Self-pay | Admitting: Family Medicine

## 2021-06-18 DIAGNOSIS — R0981 Nasal congestion: Secondary | ICD-10-CM

## 2021-06-18 DIAGNOSIS — J3489 Other specified disorders of nose and nasal sinuses: Secondary | ICD-10-CM

## 2021-07-02 ENCOUNTER — Ambulatory Visit
Admission: RE | Admit: 2021-07-02 | Discharge: 2021-07-02 | Disposition: A | Discharge status: Home / Self Care | Payer: BC Managed Care – PPO | Source: Ambulatory Visit | Attending: Family Medicine | Admitting: Family Medicine

## 2021-07-02 DIAGNOSIS — J3489 Other specified disorders of nose and nasal sinuses: Secondary | ICD-10-CM

## 2021-07-02 DIAGNOSIS — R0981 Nasal congestion: Secondary | ICD-10-CM

## 2021-10-22 ENCOUNTER — Encounter: Payer: Self-pay | Admitting: Unknown Physician Specialty

## 2021-10-23 NOTE — Discharge Instructions (Signed)
[facility] ENDOSCOPIC SINUS SURGERY [facility]  What is Functional Endoscopic Sinus Surgery?  The Surgery involves making the natural openings of the sinuses larger by removing the bony partitions that separate the sinuses from the nasal cavity.  The natural sinus lining is preserved as much as possible to allow the sinuses to resume normal function after the surgery.  In some patients nasal polyps (excessively swollen lining of the sinuses) may be removed to relieve obstruction of the sinus openings.  The surgery is performed through the nose using lighted scopes, which eliminates the need for incisions on the face.  A septoplasty is a different procedure which is sometimes performed with sinus surgery.  It involves straightening the boy partition that separates the two sides of your nose.  A crooked or deviated septum may need repair if is obstructing the sinuses or nasal airflow.  Turbinate reduction is also often performed during sinus surgery.  The turbinates are bony proturberances from the side walls of the nose which swell and can obstruct the nose in patients with sinus and allergy problems.  Their size can be surgically reduced to help relieve nasal obstruction.  What Can Sinus Surgery Do For Me?  Sinus surgery can reduce the frequency of sinus infections requiring antibiotic treatment.  This can provide improvement in nasal congestion, post-nasal drainage, facial pressure and nasal obstruction.  Surgery will NOT prevent you from ever having an infection again, so it usually only for patients who get infections 4 or more times yearly requiring antibiotics, or for infections that do not clear with antibiotics.  It will not cure nasal allergies, so patients with allergies may still require medication to treat their allergies after surgery. Surgery may improve headaches related to sinusitis, however, some people will continue to  require medication to control sinus headaches related to allergies.  Surgery will do nothing for other forms of headache (migraine, tension or cluster).  What Are the Risks of Endoscopic Sinus Surgery?  Current techniques allow surgery to be performed safely with little risk, however, there are rare complications that patients should be aware of.  Because the sinuses are located around the eyes, there is risk of eye injury, including blindness, though again, this would be quite rare. This is usually a result of bleeding behind the eye during surgery, which can effect vision, though there are treatments to protect the vision and prevent permanent injury. More serious complications would include bleeding inside the brain cavity or damage to the brain.This happens when the fluid around the brain leaks out into the sinus cavity.  Again, all of these complications are uncommon, and spinal fluid leaks can be safely managed surgically if they occur.  The most common complication of sinus surgery is bleeding from the nose, which may require packing or cauterization of the nose.  Patients with polyps may experience recurrence of the polyps that would require revision surgery.  Alterations of sense of smell or injury to the tear ducts are also rare complications.   What is the Surgery Like, and what is the Recovery?  The Surgery usually takes a couple of hours to perform, and is usually performed under a general anesthetic (completely asleep).  Patients are usually discharged home after a couple of hours.  Sometimes during surgery it is necessary to pack the nose to control bleeding, and the packing is left in place for 24 - 48 hours, and removed by your surgeon.  If   a septoplasty was performed during the procedure, there is often a splint placed which must be removed after 5-7 days.   Discomfort: Pain is usually mild to moderate, and can be controlled by prescription pain medication or acetaminophen (Tylenol).   Aspirin, Ibuprofen (Advil, Motrin), or Naprosyn (Aleve) should be avoided, as they can cause increased bleeding.  Most patients feel sinus pressure like they have a bad head cold for several days.  Sleeping with your head elevated can help reduce swelling and facial pressure, as can ice packs over the face.  A humidifier may be helpful to keep the mucous and blood from drying in the nose.   Diet: There are no specific diet restrictions, however, you should generally start with clear liquids and a light diet of bland foods because the anesthetic can cause some nausea.  Advance your diet depending on how your stomach feels.  Taking your pain medication with food will often help reduce stomach upset which pain medications can cause.  Nasal Saline Irrigation: It is important to remove blood clots and dried mucous from the nose as it is healing.  This is done by having you irrigate the nose at least 3 - 4 times daily with a salt water solution.  We recommend using NeilMed Sinus Rinse (available at the drug store).  Fill the squeeze bottle with the solution, bend over a sink, and insert the tip of the squeeze bottle into the nose  of an inch.  Point the tip of the squeeze bottle towards the inside corner of the eye on the same side your irrigating.  Squeeze the bottle and gently irrigate the nose.  If you bend forward as you do this, most of the fluid will flow back out of the nose, instead of down your throat.   The solution should be warm, near body temperature, when you irrigate.   Each time you irrigate, you should use a full squeeze bottle.   Note that if you are instructed to use Nasal Steroid Sprays at any time after your surgery, irrigate with saline BEFORE using the steroid spray, so you do not wash it all out of the nose. Another product, Nasal Saline Gel (such as AYR Nasal Saline Gel) can be applied in each nostril 3 - 4 times daily to moisture the nose and reduce scabbing or crusting.  Bleeding:   Bloody drainage from the nose can be expected for several days, and patients are instructed to irrigate their nose frequently with salt water to help remove mucous and blood clots.  The drainage may be dark red or brown, though some fresh blood may be seen intermittently, especially after irrigation.  Do not blow you nose, as bleeding may occur. If you must sneeze, keep your mouth open to allow air to escape through your mouth.  If heavy bleeding occurs: Irrigate the nose with saline to rinse out clots, then spray the nose 3 - 4 times with Afrin Nasal Decongestant Spray.  The spray will constrict the blood vessels to slow bleeding.  Pinch the lower half of your nose shut to apply pressure, and lay down with your head elevated.  Ice packs over the nose may help as well. If bleeding persists despite these measures, you should notify your doctor.  Do not use the Afrin routinely to control nasal congestion after surgery, as it can result in worsening congestion and may affect healing.     Activity: Return to work varies among patients. Most patients will be out   of work at least 5 - 7 days to recover.  Patient may return to work after they are off of narcotic pain medication, and feeling well enough to perform the functions of their job.  Patients must avoid heavy lifting (over 10 pounds) or strenuous physical for 2 weeks after surgery, so your employer may need to assign you to light duty, or keep you out of work longer if light duty is not possible.  NOTE: you should not drive, operate dangerous machinery, do any mentally demanding tasks or make any important legal or financial decisions while on narcotic pain medication and recovering from the general anesthetic.    Call Your Doctor Immediately if You Have Any of the Following: Bleeding that you cannot control with the above measures Loss of vision, double vision, bulging of the eye or black eyes. Fever over 101 degrees Neck stiffness with severe headache,  fever, nausea and change in mental state. You are always encouraged to call anytime with concerns, however, please call with requests for pain medication refills during office hours.  Office Endoscopy: During follow-up visits your doctor will remove any packing or splints that may have been placed and evaluate and clean your sinuses endoscopically.  Topical anesthetic will be used to make this as comfortable as possible, though you may want to take your pain medication prior to the visit.  How often this will need to be done varies from patient to patient.  After complete recovery from the surgery, you may need follow-up endoscopy from time to time, particularly if there is concern of recurrent infection or nasal polyps.  

## 2021-10-31 ENCOUNTER — Encounter
Admission: RE | Disposition: A | Discharge status: Home / Self Care | Payer: Self-pay | Source: Home / Self Care | Attending: Unknown Physician Specialty

## 2021-10-31 ENCOUNTER — Ambulatory Visit
Admission: RE | Admit: 2021-10-31 | Discharge: 2021-10-31 | Disposition: A | Discharge status: Home / Self Care | Payer: BC Managed Care – PPO | Attending: Unknown Physician Specialty | Admitting: Unknown Physician Specialty

## 2021-10-31 ENCOUNTER — Encounter: Payer: Self-pay | Admitting: Unknown Physician Specialty

## 2021-10-31 ENCOUNTER — Other Ambulatory Visit: Payer: Self-pay

## 2021-10-31 ENCOUNTER — Ambulatory Visit: Payer: BC Managed Care – PPO | Admitting: Anesthesiology

## 2021-10-31 DIAGNOSIS — J324 Chronic pansinusitis: Secondary | ICD-10-CM | POA: Insufficient documentation

## 2021-10-31 DIAGNOSIS — J3489 Other specified disorders of nose and nasal sinuses: Secondary | ICD-10-CM | POA: Diagnosis present

## 2021-10-31 DIAGNOSIS — J343 Hypertrophy of nasal turbinates: Secondary | ICD-10-CM | POA: Diagnosis not present

## 2021-10-31 DIAGNOSIS — J342 Deviated nasal septum: Secondary | ICD-10-CM | POA: Insufficient documentation

## 2021-10-31 HISTORY — PX: FRONTAL SINUS EXPLORATION: SHX6591

## 2021-10-31 HISTORY — PX: MAXILLARY ANTROSTOMY: SHX2003

## 2021-10-31 HISTORY — PX: SPHENOIDECTOMY: SHX2421

## 2021-10-31 HISTORY — PX: IMAGE GUIDED SINUS SURGERY: SHX6570

## 2021-10-31 HISTORY — PX: ETHMOIDECTOMY: SHX5197

## 2021-10-31 HISTORY — PX: SEPTOPLASTY: SHX2393

## 2021-10-31 HISTORY — PX: NASAL TURBINATE REDUCTION: SHX2072

## 2021-10-31 SURGERY — SINUS SURGERY, WITH IMAGING GUIDANCE
Anesthesia: General | Site: Nose

## 2021-10-31 MED ORDER — PROPOFOL 10 MG/ML IV BOLUS
INTRAVENOUS | Status: DC | PRN
  Administered 2021-10-31: 150 mg via INTRAVENOUS

## 2021-10-31 MED ORDER — HYDROCODONE-ACETAMINOPHEN 5-300 MG PO TABS: 1.0000 | ORAL_TABLET | ORAL | 0 refills | Status: DC | PRN

## 2021-10-31 MED ORDER — GLYCOPYRROLATE 0.2 MG/ML IJ SOLN
INTRAMUSCULAR | Status: DC | PRN
  Administered 2021-10-31: .1 mg via INTRAVENOUS

## 2021-10-31 MED ORDER — OXYMETAZOLINE HCL 0.05 % NA SOLN
6.0000 | Freq: Once | NASAL | Status: AC
  Administered 2021-10-31: 6 via NASAL

## 2021-10-31 MED ORDER — SCOPOLAMINE 1 MG/3DAYS TD PT72
1.0000 | MEDICATED_PATCH | Freq: Once | TRANSDERMAL | Status: DC
  Administered 2021-10-31: 1.5 mg via TRANSDERMAL

## 2021-10-31 MED ORDER — OXYMETAZOLINE HCL 0.05 % NA SOLN
NASAL | Status: DC | PRN
  Administered 2021-10-31: 1 via TOPICAL

## 2021-10-31 MED ORDER — LIDOCAINE HCL 4 % MT SOLN
OROMUCOSAL | Status: DC | PRN
  Administered 2021-10-31: 4 mL via TOPICAL

## 2021-10-31 MED ORDER — LIDOCAINE HCL 1 % IJ SOLN
INTRAMUSCULAR | Status: DC | PRN
  Administered 2021-10-31: 12 mL

## 2021-10-31 MED ORDER — FENTANYL CITRATE PF 50 MCG/ML IJ SOSY: 25.0000 ug | PREFILLED_SYRINGE | INTRAMUSCULAR | Status: DC | PRN

## 2021-10-31 MED ORDER — MIDAZOLAM HCL 5 MG/5ML IJ SOLN
INTRAMUSCULAR | Status: DC | PRN
  Administered 2021-10-31: 2 mg via INTRAVENOUS

## 2021-10-31 MED ORDER — EPHEDRINE SULFATE (PRESSORS) 50 MG/ML IJ SOLN
INTRAMUSCULAR | Status: DC | PRN
  Administered 2021-10-31 (×4): 5 mg via INTRAVENOUS
  Administered 2021-10-31: 10 mg via INTRAVENOUS
  Administered 2021-10-31: 5 mg via INTRAVENOUS

## 2021-10-31 MED ORDER — ONDANSETRON HCL 4 MG/2ML IJ SOLN
4.0000 mg | Freq: Once | INTRAMUSCULAR | Status: AC | PRN
  Administered 2021-10-31: 4 mg via INTRAVENOUS

## 2021-10-31 MED ORDER — TRAMADOL HCL 50 MG PO TABS
50.0000 mg | ORAL_TABLET | Freq: Once | ORAL | Status: AC
  Administered 2021-10-31: 50 mg via ORAL

## 2021-10-31 MED ORDER — LACTATED RINGERS IV SOLN: INTRAVENOUS | Status: DC

## 2021-10-31 MED ORDER — SUCCINYLCHOLINE CHLORIDE 200 MG/10ML IV SOSY
PREFILLED_SYRINGE | INTRAVENOUS | Status: DC | PRN
  Administered 2021-10-31: 100 mg via INTRAVENOUS

## 2021-10-31 MED ORDER — FENTANYL CITRATE (PF) 100 MCG/2ML IJ SOLN
INTRAMUSCULAR | Status: DC | PRN
  Administered 2021-10-31 (×2): 50 ug via INTRAVENOUS

## 2021-10-31 MED ORDER — DEXAMETHASONE SODIUM PHOSPHATE 4 MG/ML IJ SOLN
INTRAMUSCULAR | Status: DC | PRN
  Administered 2021-10-31: 10 mg via INTRAVENOUS

## 2021-10-31 MED ORDER — ERYTHROMYCIN 5 MG/GM OP OINT
TOPICAL_OINTMENT | OPHTHALMIC | Status: DC | PRN
  Administered 2021-10-31: 1

## 2021-10-31 MED ORDER — ACETAMINOPHEN 10 MG/ML IV SOLN
1000.0000 mg | Freq: Once | INTRAVENOUS | Status: AC
  Administered 2021-10-31: 1000 mg via INTRAVENOUS

## 2021-10-31 MED ORDER — LIDOCAINE-EPINEPHRINE 1 %-1:100000 IJ SOLN
INTRAMUSCULAR | Status: DC | PRN
  Administered 2021-10-31: 20 mL

## 2021-10-31 MED ORDER — LIDOCAINE HCL (CARDIAC) PF 100 MG/5ML IV SOSY
PREFILLED_SYRINGE | INTRAVENOUS | Status: DC | PRN
  Administered 2021-10-31: 40 mg via INTRAVENOUS

## 2021-10-31 SURGICAL SUPPLY — 37 items
BASIN GRAD PLASTIC 32OZ STRL (MISCELLANEOUS) ×1 IMPLANT
BLADE SURG 15 STRL LF DISP TIS (BLADE) IMPLANT
BLADE SURG 15 STRL SS (BLADE) ×3
BNDG COHESIVE 4X5 TAN ST LF (GAUZE/BANDAGES/DRESSINGS) ×1 IMPLANT
CABLE TRUDI DISPOSABLE (ENT DISPOSABLE) ×6 IMPLANT
CANISTER SUCT 1200ML W/VALVE (MISCELLANEOUS) ×3 IMPLANT
COAG SUCT 10F 3.5MM HAND CTRL (MISCELLANEOUS) ×3 IMPLANT
CUP MEDICINE 2OZ PLAST GRAD ST (MISCELLANEOUS) ×3 IMPLANT
DRAPE HEAD BAR (DRAPES) ×3 IMPLANT
DRESSING NASL FOAM PST OP SINU (MISCELLANEOUS) ×4 IMPLANT
DRSG NASAL FOAM POST OP SINU (MISCELLANEOUS) ×6
ELECT REM PT RETURN 9FT ADLT (ELECTROSURGICAL) ×3
ELECTRODE REM PT RTRN 9FT ADLT (ELECTROSURGICAL) ×2 IMPLANT
GAUZE SPONGE 4X4 12PLY STRL (GAUZE/BANDAGES/DRESSINGS) ×2 IMPLANT
GLOVE SURG ENC TEXT LTX SZ7.5 (GLOVE) ×6 IMPLANT
HANDLE YANKAUER SUCT BULB TIP (MISCELLANEOUS) ×3 IMPLANT
KIT TURNOVER KIT A (KITS) ×3 IMPLANT
NDL HYPO 25GX1X1/2 BEV (NEEDLE) ×2 IMPLANT
NEEDLE HYPO 25GX1X1/2 BEV (NEEDLE) ×3 IMPLANT
NS IRRIG 500ML POUR BTL (IV SOLUTION) ×3 IMPLANT
PACK TONSIL AND ADENOID CUSTOM (PACKS) ×1 IMPLANT
SLEEVE PROTECTION STRL DISP (MISCELLANEOUS) ×1 IMPLANT
SOL ANTI-FOG 6CC FOG-OUT (MISCELLANEOUS) ×2 IMPLANT
SOL FOG-OUT ANTI-FOG 6CC (MISCELLANEOUS) ×1
SPLINT NASAL SEPTAL BLV .50 ST (MISCELLANEOUS) ×1 IMPLANT
SPONGE NEURO XRAY DETECT 1X3 (DISPOSABLE) ×3 IMPLANT
STRAP BODY AND KNEE 60X3 (MISCELLANEOUS) ×3 IMPLANT
SUT CHROMIC 3-0 (SUTURE) ×6
SUT CHROMIC 3-0 KS 27XMFL CR (SUTURE) ×4
SUT ETHILON 3-0 KS 30 BLK (SUTURE) ×3 IMPLANT
SUT PLAIN GUT 4-0 (SUTURE) ×1 IMPLANT
SUTURE CHRMC 3-0 KS 27XMFL CR (SUTURE) ×2 IMPLANT
SYR 10ML LL (SYRINGE) ×3 IMPLANT
TOWEL OR 17X26 4PK STRL BLUE (TOWEL DISPOSABLE) ×3 IMPLANT
TRACKER DISPOSABLE PAITIENT (MISCELLANEOUS) ×4 IMPLANT
TUBING CONNECTING 10 (TUBING) ×3 IMPLANT
WATER STERILE IRR 250ML POUR (IV SOLUTION) ×3 IMPLANT

## 2021-10-31 NOTE — H&P (Signed)
The patient's history has been reviewed, patient examined, no change in status, stable for surgery.  Questions were answered to the patients satisfaction.  

## 2021-10-31 NOTE — Anesthesia Postprocedure Evaluation (Signed)
Anesthesia Post Note  Patient: Claudia Hawkins  Procedure(s) Performed: IMAGE GUIDED SINUS SURGERY (Nose) SEPTOPLASTY (Nose) TURBINATE REDUCTION/SUBMUCOSAL RESECTION (Bilateral: Nose) MAXILLARY ANTROSTOMY WITH TISSUE REMOVAL (Bilateral: Nose) ETHMOIDECTOMY (Bilateral: Nose) FRONTAL SINUS EXPLORATION (Bilateral: Nose) SPHENOIDECTOMY (Bilateral: Nose)     Patient location during evaluation: PACU Anesthesia Type: General Level of consciousness: awake and alert and oriented Pain management: satisfactory to patient Vital Signs Assessment: post-procedure vital signs reviewed and stable Respiratory status: spontaneous breathing, nonlabored ventilation and respiratory function stable Cardiovascular status: blood pressure returned to baseline and stable Postop Assessment: Adequate PO intake and No signs of nausea or vomiting Anesthetic complications: no   No notable events documented.  [place]

## 2021-11-03 ENCOUNTER — Encounter: Payer: Self-pay | Admitting: Unknown Physician Specialty

## 2021-11-03 LAB — SURGICAL PATHOLOGY

## 2021-11-17 ENCOUNTER — Other Ambulatory Visit: Payer: Self-pay | Admitting: Unknown Physician Specialty

## 2021-11-17 DIAGNOSIS — I82403 Acute embolism and thrombosis of unspecified deep veins of lower extremity, bilateral: Secondary | ICD-10-CM

## 2021-11-18 ENCOUNTER — Other Ambulatory Visit (INDEPENDENT_AMBULATORY_CARE_PROVIDER_SITE_OTHER): Payer: Self-pay | Admitting: Vascular Surgery

## 2021-11-18 ENCOUNTER — Ambulatory Visit
Admission: RE | Admit: 2021-11-18 | Discharge: 2021-11-18 | Disposition: A | Discharge status: Home / Self Care | Payer: BC Managed Care – PPO | Source: Ambulatory Visit | Attending: Unknown Physician Specialty | Admitting: Unknown Physician Specialty

## 2021-11-18 DIAGNOSIS — I82409 Acute embolism and thrombosis of unspecified deep veins of unspecified lower extremity: Secondary | ICD-10-CM

## 2021-11-18 DIAGNOSIS — I82403 Acute embolism and thrombosis of unspecified deep veins of lower extremity, bilateral: Secondary | ICD-10-CM

## 2021-11-18 DIAGNOSIS — I82451 Acute embolism and thrombosis of right peroneal vein: Secondary | ICD-10-CM

## 2021-11-18 HISTORY — DX: Acute embolism and thrombosis of unspecified deep veins of unspecified lower extremity: I82.409

## 2021-11-18 MED ORDER — APIXABAN 5 MG PO TABS: 5.0000 mg | ORAL_TABLET | Freq: Two times a day (BID) | ORAL | 3 refills | Status: DC

## 2021-11-18 NOTE — Progress Notes (Signed)
The patient presented for follow-up regarding her recent sinus surgery.  On evaluation by Dr. Jenne Campus she was noting increased pain in her right calf.  A duplex ultrasound was obtained which demonstrated proximal peroneal DVT.  I will initiate Eliquis therapy and see her in the office either this Thursday or this coming Monday.

## 2021-11-19 DIAGNOSIS — I82409 Acute embolism and thrombosis of unspecified deep veins of unspecified lower extremity: Secondary | ICD-10-CM | POA: Insufficient documentation

## 2021-11-19 NOTE — Progress Notes (Signed)
MRN : [id number]  Claudia Hawkins is a 51 y.o. ([birth year]) female who presents with chief complaint of legs hurt and swell.  History of Present Illness:  The patient presents to the office for evaluation of DVT.  DVT was identified at [facility] by Duplex ultrasound.  The initial symptoms were pain and swelling in the calf of the right lower extremity.  Of note the patient underwent sinus surgery October 31, 2021.  It was at a routine follow-up with Dr. Jenne Campus that she commented her calf had been tight and somewhat painful particularly with standing.  This is what prompted the ultrasound demonstrating the peroneal thrombus  The patient notes the leg continues to be very painful with dependency and swells quite a bite.  Symptoms are much better with elevation.  The patient notes minimal edema in the morning which steadily worsens throughout the day.    The patient has not been using compression therapy at this point.  No SOB or pleuritic chest pains.  No cough or hemoptysis.  No blood per rectum or blood in any sputum.  No excessive bruising per the patient.   No recent shortening of the patient's walking distance or new symptoms consistent with claudication.  No history of rest pain symptoms. No new ulcers or wounds of the lower extremities have occurred.  The patient denies amaurosis fugax or recent TIA symptoms. There are no recent neurological changes noted. No recent episodes of angina or shortness of breath documented.    No outpatient medications have been marked as taking for the 11/20/21 encounter (Appointment) with Gilda Crease, Latina Craver, MD.    Past Medical History:  Diagnosis Date   Adult ADHD    Allergic rhinitis    Asthma    very rarely uses albuterol    COVID-19 12/05/2019   Dysrhythmia    tachycardia    History of kidney stones     Past Surgical History:  Procedure Laterality Date   ANTERIOR AND POSTERIOR REPAIR N/A 01/29/2020   Procedure: ANTERIOR  (CYSTOCELE) AND POSTERIOR REPAIR (RECTOCELE) SACROSPINOUS LIGAMENT SUSPENSION;  Surgeon: Harold Hedge, MD;  Location: [facility];  Service: Gynecology;  Laterality: N/A;   BLADDER SUSPENSION N/A 01/29/2020   Procedure: Transobturator sling;  Surgeon: Harold Hedge, MD;  Location: [facility];  Service: Gynecology;  Laterality: N/A;   COLONOSCOPY WITH PROPOFOL N/A 11/18/2020   Procedure: COLONOSCOPY WITH PROPOFOL;  Surgeon: Midge Minium, MD;  Location: [facility];  Service: Endoscopy;  Laterality: N/A;   ETHMOIDECTOMY Bilateral 10/31/2021   Procedure: ETHMOIDECTOMY;  Surgeon: Linus Salmons, MD;  Location: [facility];  Service: ENT;  Laterality: Bilateral;   FRONTAL SINUS EXPLORATION Bilateral 10/31/2021   Procedure: FRONTAL SINUS EXPLORATION;  Surgeon: Linus Salmons, MD;  Location: [facility];  Service: ENT;  Laterality: Bilateral;   IMAGE GUIDED SINUS SURGERY N/A 10/31/2021   Procedure: IMAGE GUIDED SINUS SURGERY;  Surgeon: Linus Salmons, MD;  Location: [facility];  Service: ENT;  Laterality: N/A;  placed disk on OR charge nurse desk 6-02  kp per brenda disk is good. 6/2   LAPAROSCOPIC VAGINAL HYSTERECTOMY WITH SALPINGO OOPHORECTOMY Bilateral 01/29/2020   Procedure: LAPAROSCOPIC ASSISTED VAGINAL HYSTERECTOMY WITH SALPINGO OOPHORECTOMY;  Surgeon: Harold Hedge, MD;  Location: [facility];  Service: Gynecology;  Laterality: Bilateral;  need bed   MAXILLARY ANTROSTOMY Bilateral 10/31/2021   Procedure: MAXILLARY ANTROSTOMY WITH TISSUE REMOVAL;  Surgeon: Linus Salmons, MD;  Location: [facility];  Service: ENT;  Laterality: Bilateral;   NASAL TURBINATE REDUCTION Bilateral 10/31/2021   Procedure: TURBINATE REDUCTION/SUBMUCOSAL RESECTION;  Surgeon: Linus Salmons, MD;  Location: [facility];  Service: ENT;  Laterality: Bilateral;   right knee surgery      right wrist surgery      SEPTOPLASTY N/A  10/31/2021   Procedure: SEPTOPLASTY;  Surgeon: Linus Salmons, MD;  Location: [facility];  Service: ENT;  Laterality: N/A;   SMALL INTESTINE SURGERY     51 years old   SPHENOIDECTOMY Bilateral 10/31/2021   Procedure: SPHENOIDECTOMY;  Surgeon: Linus Salmons, MD;  Location: [facility];  Service: ENT;  Laterality: Bilateral;   torn meniscus right knee surgery      TUBAL LIGATION      Social History Social History   Tobacco Use   Smoking status: Never   Smokeless tobacco: Never  Vaping Use   Vaping Use: Never used  Substance Use Topics   Alcohol use: Yes    Comment: Social drinker    Drug use: No    Family History Family History  Problem Relation Age of Onset   Hypertension Father     Allergies  Allergen Reactions   Clindamycin/Lincomycin Hives    GI upset   Hydrocodone Itching   Bactroban [Mupirocin] Itching and Rash    At application site    Benzalkonium Chloride Rash   Neosporin [Bacitracin-Polymyxin B] Itching and Rash    At application site   Oxycontin [Oxycodone] Hives, Itching and Rash   Sulfa Antibiotics Hives, Diarrhea, Nausea And Vomiting and Rash   Wound Dressing Adhesive Itching and Rash     REVIEW OF SYSTEMS (Negative unless checked)  Constitutional: [] Weight loss  [] Fever  [] Chills Cardiac: [] Chest pain   [] Chest pressure   [] Palpitations   [] Shortness of breath when laying flat   [] Shortness of breath with exertion. Vascular:  [] Pain in legs with walking   [x] Pain in legs at rest  [] History of DVT   [] Phlebitis   [x] Swelling in legs   [] Varicose veins   [] Non-healing ulcers Pulmonary:   [] Uses home oxygen   [] Productive cough   [] Hemoptysis   [] Wheeze  [] COPD   [] Asthma Neurologic:  [] Dizziness   [] Seizures   [] History of stroke   [] History of TIA  [] Aphasia   [] Vissual changes   [] Weakness or numbness in arm   [] Weakness or numbness in leg Musculoskeletal:   [] Joint swelling   [] Joint pain   [] Low back pain Hematologic:  [] Easy  bruising  [] Easy bleeding   [] Hypercoagulable state   [] Anemic Gastrointestinal:  [] Diarrhea   [] Vomiting  [] Gastroesophageal reflux/heartburn   [] Difficulty swallowing. Genitourinary:  [] Chronic kidney disease   [] Difficult urination  [] Frequent urination   [] Blood in urine Skin:  [] Rashes   [] Ulcers  Psychological:  [] History of anxiety   []  History of major depression.  Physical Examination  There were no vitals filed for this visit. There is no height or weight on file to calculate BMI. Gen: WD/WN, NAD Head: [place]/AT, No temporalis wasting.  Ear/Nose/Throat: Hearing grossly intact, nares w/o erythema or drainage, pinna without lesions Eyes: PER, EOMI, sclera nonicteric.  Neck: Supple, no gross masses.  No JVD.  Pulmonary:  Good air movement, no audible wheezing, no use of accessory muscles.  Cardiac: RRR, precordium not hyperdynamic. Vascular:  scattered varicosities present bilaterally.  Mild venous stasis changes to the legs bilaterally.  1+ soft pitting edema  Vessel  Right Left  Radial Palpable Palpable  Gastrointestinal: soft, non-distended. No guarding/no peritoneal signs.  Musculoskeletal: M/S 5/5 throughout.  No deformity.  Neurologic: CN 2-12 intact. Pain and light touch intact in extremities.  Symmetrical.  Speech is fluent. Motor exam as listed above. Psychiatric: Judgment intact, Mood & affect appropriate for pt's clinical situation. Dermatologic: Venous rashes no ulcers noted.  No changes consistent with cellulitis. Lymph : No lichenification or skin changes of chronic lymphedema.  CBC Lab Results  Component Value Date   WBC 11.5 (H) 01/30/2020   HGB 11.7 (L) 01/30/2020   HCT 34.0 (L) 01/30/2020   MCV 90.4 01/30/2020   PLT 230 01/30/2020    BMET No results found for: "NA", "K", "CL", "CO2", "GLUCOSE", "BUN", "CREATININE", "CALCIUM", "GFRNONAA", "GFRAA" CrCl cannot be calculated (No successful lab value found.).  COAG No results found for: "INR",  "PROTIME"  Radiology US Venous Img Lower Bilateral (DVT)  Result Date: 11/18/2021 CLINICAL DATA:  Acute embolism and thrombosis of unspecified deep veins of lower extremity, bilateral (HCC)R/O DVT EXAM: BILATERAL LOWER EXTREMITY VENOUS DOPPLER ULTRASOUND TECHNIQUE: Gray-scale sonography with graded compression, as well as color Doppler and duplex ultrasound were performed to evaluate the lower extremity deep venous systems from the level of the common femoral vein and including the common femoral, femoral, profunda femoral, popliteal and calf veins including the posterior tibial, peroneal and gastrocnemius veins when visible. The superficial great saphenous vein was also interrogated. Spectral Doppler was utilized to evaluate flow at rest and with distal augmentation maneuvers in the common femoral, femoral and popliteal veins. COMPARISON:  None Available. FINDINGS: RIGHT LOWER EXTREMITY Common Femoral Vein: No evidence of thrombus. Normal compressibility, respiratory phasicity and response to augmentation. Saphenofemoral Junction: No evidence of thrombus. Normal compressibility and flow on color Doppler imaging. Profunda Femoral Vein: No evidence of thrombus. Normal compressibility and flow on color Doppler imaging. Femoral Vein: No evidence of thrombus. Normal compressibility, respiratory phasicity and response to augmentation. Popliteal Vein: No evidence of thrombus. Normal compressibility, respiratory phasicity and response to augmentation. Calf Veins: There is expansile hypoechoic thrombus resulting in occlusive DVT of the peroneal veins in the proximal and mid calf. The posterior tibial veins are patent. LEFT LOWER EXTREMITY Common Femoral Vein: No evidence of thrombus. Normal compressibility, respiratory phasicity and response to augmentation. Saphenofemoral Junction: No evidence of thrombus. Normal compressibility and flow on color Doppler imaging. Profunda Femoral Vein: No evidence of thrombus. Normal  compressibility and flow on color Doppler imaging. Femoral Vein: No evidence of thrombus. Normal compressibility, respiratory phasicity and response to augmentation. Popliteal Vein: No evidence of thrombus. Normal compressibility, respiratory phasicity and response to augmentation. Calf Veins: No evidence of thrombus. Normal compressibility and flow on color Doppler imaging. Other Findings:  None. IMPRESSION: 1. In the right lower extremity, there is acute appearing occlusive DVT involving the right peroneal veins in the calf. 2. No findings of DVT in the left lower extremity. These results will be called to the ordering clinician or representative by the Radiologist Assistant, and communication documented in the PACS or [employer]. Electronically Signed   By: Olive Bass M.D.   On: 11/18/2021 14:19     Assessment/Plan 1. Acute deep vein thrombosis (DVT) of right peroneal vein (HCC) Recommend:   No surgery or intervention at this point in time.  IVC filter is not indicated at present.  Patient's duplex ultrasound of the venous system showed DVT in the peroneal veins.  The patient is initiated on anticoagulation, Eliquis  Elevation was stressed, such as the use of a recliner.  I have discussed  DVT and post phlebitic changes such as swelling and why it  causes symptoms such as pain.  The patient should wear graduated compression stockings beginning after three full days of anticoagulation.  The compression should be worn on a daily basis. The patient should wearing the stockings first thing in the morning and removing them in the evening. The patient should not to sleep in the stockings.  In addition, behavioral modification including elevation during the day and avoidance of prolonged dependency will be initiated.    The patient will continue anticoagulation for now as there have not been any problems or complications at this point.    2. Uncomplicated asthma, unspecified asthma  severity, unspecified whether persistent Continue pulmonary medications and aerosols as already ordered, these medications have been reviewed and there are no changes at this time.      Levora Dredge, MD  11/19/2021 5:17 PM

## 2021-11-20 ENCOUNTER — Encounter (INDEPENDENT_AMBULATORY_CARE_PROVIDER_SITE_OTHER): Payer: Self-pay | Admitting: Vascular Surgery

## 2021-11-20 ENCOUNTER — Ambulatory Visit (INDEPENDENT_AMBULATORY_CARE_PROVIDER_SITE_OTHER): Payer: BC Managed Care – PPO | Admitting: Vascular Surgery

## 2021-11-20 VITALS — BP 126/78 | HR 92 | Resp 18 | Ht 63.0 in | Wt 136.0 lb

## 2021-11-20 DIAGNOSIS — I82451 Acute embolism and thrombosis of right peroneal vein: Secondary | ICD-10-CM | POA: Diagnosis not present

## 2021-11-20 DIAGNOSIS — J45909 Unspecified asthma, uncomplicated: Secondary | ICD-10-CM

## 2021-11-21 ENCOUNTER — Encounter (INDEPENDENT_AMBULATORY_CARE_PROVIDER_SITE_OTHER): Payer: Self-pay | Admitting: Vascular Surgery

## 2021-11-24 ENCOUNTER — Telehealth (INDEPENDENT_AMBULATORY_CARE_PROVIDER_SITE_OTHER): Payer: Self-pay

## 2021-11-24 NOTE — Telephone Encounter (Signed)
She can come in for a dvt study only.  If the study is still the same, the patient will continue on eliquis as prescribed.   if it has moved progressively we discuss if intervention is possible or necessary.

## 2021-11-25 ENCOUNTER — Other Ambulatory Visit (INDEPENDENT_AMBULATORY_CARE_PROVIDER_SITE_OTHER): Payer: Self-pay | Admitting: Nurse Practitioner

## 2021-11-25 DIAGNOSIS — I82451 Acute embolism and thrombosis of right peroneal vein: Secondary | ICD-10-CM

## 2021-11-25 DIAGNOSIS — R252 Cramp and spasm: Secondary | ICD-10-CM

## 2021-11-25 NOTE — Telephone Encounter (Signed)
Patient has been schedule to come in for dvt study

## 2021-11-25 NOTE — Telephone Encounter (Signed)
Left a detailed message on patient voicemail from note below

## 2021-11-26 ENCOUNTER — Ambulatory Visit (INDEPENDENT_AMBULATORY_CARE_PROVIDER_SITE_OTHER): Payer: BC Managed Care – PPO

## 2021-11-26 DIAGNOSIS — R252 Cramp and spasm: Secondary | ICD-10-CM

## 2021-11-26 DIAGNOSIS — I82451 Acute embolism and thrombosis of right peroneal vein: Secondary | ICD-10-CM

## 2022-02-18 ENCOUNTER — Other Ambulatory Visit (INDEPENDENT_AMBULATORY_CARE_PROVIDER_SITE_OTHER): Payer: Self-pay | Admitting: Vascular Surgery

## 2022-02-18 DIAGNOSIS — I82451 Acute embolism and thrombosis of right peroneal vein: Secondary | ICD-10-CM

## 2022-02-19 ENCOUNTER — Encounter (INDEPENDENT_AMBULATORY_CARE_PROVIDER_SITE_OTHER): Payer: BC Managed Care – PPO

## 2022-02-19 ENCOUNTER — Ambulatory Visit (INDEPENDENT_AMBULATORY_CARE_PROVIDER_SITE_OTHER): Payer: BC Managed Care – PPO | Admitting: Vascular Surgery

## 2022-03-08 NOTE — Progress Notes (Unsigned)
MRN : [id number]  Claudia Hawkins is a 51 y.o. (02-Jun-1970) female who presents with chief complaint of legs hurt and swell.  History of Present Illness:  The patient presents to the office for evaluation of DVT.  DVT was identified at [facility] by Duplex ultrasound.  The initial symptoms were pain and swelling in the calf of the right lower extremity.  Of note the patient underwent sinus surgery October 31, 2021.  It was at a routine follow-up with Dr. Tami Ribas that she commented her calf had been tight and somewhat painful particularly with standing.  This is what prompted the ultrasound demonstrating the peroneal thrombus.  Patient's previous duplex ultrasound of the venous system showed DVT in the peroneal veins.   The patient is on anticoagulation, Eliquis    The patient notes the leg continues to be very painful with dependency and swells quite a bite.  Symptoms are much better with elevation.  The patient notes minimal edema in the morning which steadily worsens throughout the day.     The patient has been using compression therapy.   No SOB or pleuritic chest pains.  No cough or hemoptysis.   No blood per rectum or blood in any sputum.  No excessive bruising per the patient.    No recent shortening of the patient's walking distance or new symptoms consistent with claudication.  No history of rest pain symptoms. No new ulcers or wounds of the lower extremities have occurred.   Duplex ultrasound of the venous system of the right leg shows complete resolution of the previous DVT, normal venous duplex.  No outpatient medications have been marked as taking for the 03/09/22 encounter (Appointment) with Delana Meyer, Dolores Lory, MD.    Past Medical History:  Diagnosis Date   Adult ADHD    Allergic rhinitis    Asthma    very rarely uses albuterol    COVID-19 12/05/2019   Dysrhythmia    tachycardia    History of kidney stones     Past Surgical History:  Procedure Laterality Date    ANTERIOR AND POSTERIOR REPAIR N/A 01/29/2020   Procedure: ANTERIOR (CYSTOCELE) AND POSTERIOR REPAIR (RECTOCELE) SACROSPINOUS LIGAMENT SUSPENSION;  Surgeon: Everlene Farrier, MD;  Location: [place];  Service: Gynecology;  Laterality: N/A;   BLADDER SUSPENSION N/A 01/29/2020   Procedure: Transobturator sling;  Surgeon: Everlene Farrier, MD;  Location: [facility];  Service: Gynecology;  Laterality: N/A;   COLONOSCOPY WITH PROPOFOL N/A 11/18/2020   Procedure: COLONOSCOPY WITH PROPOFOL;  Surgeon: Lucilla Lame, MD;  Location: [place];  Service: Endoscopy;  Laterality: N/A;   ETHMOIDECTOMY Bilateral 10/31/2021   Procedure: ETHMOIDECTOMY;  Surgeon: Beverly Gust, MD;  Location: [place];  Service: ENT;  Laterality: Bilateral;   FRONTAL SINUS EXPLORATION Bilateral 10/31/2021   Procedure: FRONTAL SINUS EXPLORATION;  Surgeon: Beverly Gust, MD;  Location: [place];  Service: ENT;  Laterality: Bilateral;   IMAGE GUIDED SINUS SURGERY N/A 10/31/2021   Procedure: IMAGE GUIDED SINUS SURGERY;  Surgeon: Beverly Gust, MD;  Location: [place];  Service: ENT;  Laterality: N/A;  placed disk on OR charge nurse desk 6-02  kp per brenda disk is good. 6/2   LAPAROSCOPIC VAGINAL HYSTERECTOMY WITH SALPINGO OOPHORECTOMY Bilateral 01/29/2020   Procedure: LAPAROSCOPIC ASSISTED VAGINAL HYSTERECTOMY WITH SALPINGO OOPHORECTOMY;  Surgeon: Everlene Farrier, MD;  Location: [place];  Service: Gynecology;  Laterality: Bilateral;  need bed  MAXILLARY ANTROSTOMY Bilateral 10/31/2021   Procedure: MAXILLARY ANTROSTOMY WITH TISSUE REMOVAL;  Surgeon: Beverly Gust, MD;  Location: [place];  Service: ENT;  Laterality: Bilateral;   NASAL TURBINATE REDUCTION Bilateral 10/31/2021   Procedure: TURBINATE REDUCTION/SUBMUCOSAL RESECTION;  Surgeon: Beverly Gust, MD;  Location: [place];  Service: ENT;  Laterality: Bilateral;    right knee surgery      right wrist surgery      SEPTOPLASTY N/A 10/31/2021   Procedure: SEPTOPLASTY;  Surgeon: Beverly Gust, MD;  Location: [place];  Service: ENT;  Laterality: N/A;   SMALL INTESTINE SURGERY     51 years old   SPHENOIDECTOMY Bilateral 10/31/2021   Procedure: SPHENOIDECTOMY;  Surgeon: Beverly Gust, MD;  Location: [place];  Service: ENT;  Laterality: Bilateral;   torn meniscus right knee surgery      TUBAL LIGATION      Social History Social History   Tobacco Use   Smoking status: Never   Smokeless tobacco: Never  Vaping Use   Vaping Use: Never used  Substance Use Topics   Alcohol use: Yes    Comment: Social drinker    Drug use: No    Family History Family History  Problem Relation Age of Onset   Hypertension Father     Allergies  Allergen Reactions   Clindamycin/Lincomycin Hives    GI upset   Hydrocodone Itching   Bactroban [Mupirocin] Itching and Rash    At application site    Benzalkonium Chloride Rash   Neosporin [Bacitracin-Polymyxin B] Itching and Rash    At application site   Oxycontin [Oxycodone] Hives, Itching and Rash   Sulfa Antibiotics Hives, Diarrhea, Nausea And Vomiting and Rash   Wound Dressing Adhesive Itching and Rash     REVIEW OF SYSTEMS (Negative unless checked)  Constitutional: [] Weight loss  [] Fever  [] Chills Cardiac: [] Chest pain   [] Chest pressure   [] Palpitations   [] Shortness of breath when laying flat   [] Shortness of breath with exertion. Vascular:  [] Pain in legs with walking   [x] Pain in legs at rest  [x] History of DVT   [] Phlebitis   [x] Swelling in legs   [] Varicose veins   [] Non-healing ulcers Pulmonary:   [] Uses home oxygen   [] Productive cough   [] Hemoptysis   [] Wheeze  [] COPD   [x] Asthma Neurologic:  [] Dizziness   [] Seizures   [] History of stroke   [] History of TIA  [] Aphasia   [] Vissual changes   [] Weakness or numbness in arm   [] Weakness or numbness in leg Musculoskeletal:    [] Joint swelling   [] Joint pain   [] Low back pain Hematologic:  [] Easy bruising  [] Easy bleeding   [] Hypercoagulable state   [] Anemic Gastrointestinal:  [] Diarrhea   [] Vomiting  [] Gastroesophageal reflux/heartburn   [] Difficulty swallowing. Genitourinary:  [] Chronic kidney disease   [] Difficult urination  [] Frequent urination   [] Blood in urine Skin:  [] Rashes   [] Ulcers  Psychological:  [] History of anxiety   []  History of major depression.  Physical Examination  There were no vitals filed for this visit. There is no height or weight on file to calculate BMI. Gen: WD/WN, NAD Head: [place]/AT, No temporalis wasting.  Ear/Nose/Throat: Hearing grossly intact, nares w/o erythema or drainage, pinna without lesions Eyes: PER, EOMI, sclera nonicteric.  Neck: Supple, no gross masses.  No JVD.  Pulmonary:  Good air movement, no audible wheezing, no use of accessory muscles.  Cardiac: RRR, precordium not hyperdynamic. Vascular:  scattered varicosities present bilaterally.  Trace venous  stasis changes to the legs bilaterally. Trace soft pitting edema  Vessel Right Left  Radial Palpable Palpable  Gastrointestinal: soft, non-distended. No guarding/no peritoneal signs.  Musculoskeletal: M/S 5/5 throughout.  No deformity.  Neurologic: CN 2-12 intact. Pain and light touch intact in extremities.  Symmetrical.  Speech is fluent. Motor exam as listed above. Psychiatric: Judgment intact, Mood & affect appropriate for pt's clinical situation. Dermatologic: Venous rashes no ulcers noted.  No changes consistent with cellulitis. Lymph : No lichenification or skin changes of chronic lymphedema.  CBC Lab Results  Component Value Date   WBC 11.5 (H) 01/30/2020   HGB 11.7 (L) 01/30/2020   HCT 34.0 (L) 01/30/2020   MCV 90.4 01/30/2020   PLT 230 01/30/2020    BMET No results found for: "NA", "K", "CL", "CO2", "GLUCOSE", "BUN", "CREATININE", "CALCIUM", "GFRNONAA", "GFRAA" CrCl cannot be calculated (No  successful lab value found.).  COAG No results found for: "INR", "PROTIME"  Radiology No results found.   Assessment/Plan 1. Acute deep vein thrombosis (DVT) of right peroneal vein (HCC) Recommend:   No surgery or intervention at this point in time.  IVC filter is not indicated at present.  Patient's duplex ultrasound of the venous system shows resolution of DVT on the right .  The patient is initiated on anticoagulation   Elevation was stressed, such as the use of a recliner.  I have reviewed my discussion regarding DVT and post phlebitic changes such as swelling and why it  causes symptoms such as pain.  The patient should wear graduated compression stockings beginning after three full days of anticoagulation.  The compression should be worn on a daily basis. The patient should wearing the stockings first thing in the morning and removing them in the evening. The patient should not to sleep in the stockings.  In addition, behavioral modification including elevation during the day and avoidance of prolonged dependency will be initiated.    The patient will continue anticoagulation for three more months as there have not been any problems or complications at this point.    - VAS [place] LOWER EXTREMITY VENOUS (DVT); Future  2. Atrial tachycardia Continue Metoprolol as already ordered, these medications have been reviewed and there are no changes at this time.  Continue anticoagulation for now  3. Uncomplicated asthma, unspecified asthma severity, unspecified whether persistent Continue pulmonary medications and aerosols as already ordered, these medications have been reviewed and there are no changes at this time.      Hortencia Pilar, MD  03/08/2022 10:59 AM

## 2022-03-09 ENCOUNTER — Encounter (INDEPENDENT_AMBULATORY_CARE_PROVIDER_SITE_OTHER): Payer: Self-pay | Admitting: Vascular Surgery

## 2022-03-09 ENCOUNTER — Ambulatory Visit (INDEPENDENT_AMBULATORY_CARE_PROVIDER_SITE_OTHER): Payer: BC Managed Care – PPO | Admitting: Vascular Surgery

## 2022-03-09 ENCOUNTER — Encounter (INDEPENDENT_AMBULATORY_CARE_PROVIDER_SITE_OTHER): Payer: Self-pay

## 2022-03-09 ENCOUNTER — Ambulatory Visit (INDEPENDENT_AMBULATORY_CARE_PROVIDER_SITE_OTHER): Payer: BC Managed Care – PPO

## 2022-03-09 VITALS — BP 134/83 | HR 86 | Resp 16 | Wt 137.6 lb

## 2022-03-09 DIAGNOSIS — I82451 Acute embolism and thrombosis of right peroneal vein: Secondary | ICD-10-CM | POA: Diagnosis not present

## 2022-03-09 DIAGNOSIS — J45909 Unspecified asthma, uncomplicated: Secondary | ICD-10-CM

## 2022-03-09 DIAGNOSIS — I4719 Other supraventricular tachycardia: Secondary | ICD-10-CM

## 2022-03-10 ENCOUNTER — Other Ambulatory Visit (INDEPENDENT_AMBULATORY_CARE_PROVIDER_SITE_OTHER): Payer: Self-pay | Admitting: Vascular Surgery

## 2022-03-11 ENCOUNTER — Encounter (INDEPENDENT_AMBULATORY_CARE_PROVIDER_SITE_OTHER): Payer: Self-pay | Admitting: Vascular Surgery

## 2022-03-29 ENCOUNTER — Ambulatory Visit
Admission: RE | Admit: 2022-03-29 | Discharge: 2022-03-29 | Disposition: A | Discharge status: Home / Self Care | Payer: BC Managed Care – PPO | Source: Ambulatory Visit | Attending: Emergency Medicine | Admitting: Emergency Medicine

## 2022-03-29 VITALS — BP 134/83 | HR 96 | Temp 98.1°F | Resp 18 | Ht 63.0 in | Wt 139.0 lb

## 2022-03-29 DIAGNOSIS — R300 Dysuria: Secondary | ICD-10-CM | POA: Diagnosis present

## 2022-03-29 LAB — POCT URINALYSIS DIP (MANUAL ENTRY)
Glucose, UA: NEGATIVE mg/dL
Ketones, POC UA: NEGATIVE mg/dL
Leukocytes, UA: NEGATIVE
Nitrite, UA: NEGATIVE
Protein Ur, POC: NEGATIVE mg/dL
Spec Grav, UA: 1.025 (ref 1.010–1.025)
Urobilinogen, UA: 0.2 E.U./dL
pH, UA: 5 (ref 5.0–8.0)

## 2022-03-29 NOTE — Discharge Instructions (Addendum)
The urine culture is pending.  Follow up with your primary care provider if your symptoms are not improving.    

## 2022-03-29 NOTE — ED Triage Notes (Signed)
Patient to Urgent Care with complaints of urinary frequency and dysuria. Reports Thursday afternoon she started having some urinary frequency and having some bladder leakage. Dysuria that started Friday, worsening urinary frequency.  Denies any known fevers.   Has been taking prescription strength [id number].

## 2022-03-29 NOTE — ED Provider Notes (Signed)
Roderic Palau    CSN: [id number] Arrival date & time: 03/29/22  1010      History   Chief Complaint Chief Complaint  Patient presents with   URI    Frequent urinating and stinging pain when I do pee. - Entered by patient   Urinary Frequency    HPI Claudia Hawkins is a 51 y.o. female.  Patient presents with dysuria and urinary frequency x2 days.  Treatment at home with prescribed urinary discomfort medication.  She denies fever, chills, flank pain, abdominal pain, hematuria, vaginal discharge, pelvic pain, or other symptoms.  Her medical history includes kidney stones.  The history is provided by the patient and medical records.    Past Medical History:  Diagnosis Date   Adult ADHD    Allergic rhinitis    Asthma    very rarely uses albuterol    COVID-19 12/05/2019   Dysrhythmia    tachycardia    History of kidney stones     Patient Active Problem List   Diagnosis Date Noted   DVT (deep venous thrombosis) ([place]) 11/19/2021   Encounter for screening colonoscopy    Pelvic prolapse 01/29/2020   S/P LEEP (loop electrosurgical excision procedure) 10/27/2019   S/P endometrial ablation 10/27/2019   Tachycardia 10/26/2018   Palpitations 03/25/2015   Atrial tachycardia 03/25/2015   Asthma 03/25/2015   Attention deficit hyperactivity disorder 03/25/2015    Past Surgical History:  Procedure Laterality Date   ANTERIOR AND POSTERIOR REPAIR N/A 01/29/2020   Procedure: ANTERIOR (CYSTOCELE) AND POSTERIOR REPAIR (RECTOCELE) SACROSPINOUS LIGAMENT SUSPENSION;  Surgeon: Everlene Farrier, MD;  Location: [place];  Service: Gynecology;  Laterality: N/A;   BLADDER SUSPENSION N/A 01/29/2020   Procedure: Transobturator sling;  Surgeon: Everlene Farrier, MD;  Location: [facility];  Service: Gynecology;  Laterality: N/A;   COLONOSCOPY WITH PROPOFOL N/A 11/18/2020   Procedure: COLONOSCOPY WITH PROPOFOL;  Surgeon: Lucilla Lame, MD;  Location: [place];  Service: Endoscopy;  Laterality: N/A;   ETHMOIDECTOMY Bilateral 10/31/2021   Procedure: ETHMOIDECTOMY;  Surgeon: Beverly Gust, MD;  Location: [place];  Service: ENT;  Laterality: Bilateral;   FRONTAL SINUS EXPLORATION Bilateral 10/31/2021   Procedure: FRONTAL SINUS EXPLORATION;  Surgeon: Beverly Gust, MD;  Location: [place];  Service: ENT;  Laterality: Bilateral;   IMAGE GUIDED SINUS SURGERY N/A 10/31/2021   Procedure: IMAGE GUIDED SINUS SURGERY;  Surgeon: Beverly Gust, MD;  Location: [place];  Service: ENT;  Laterality: N/A;  placed disk on OR charge nurse desk 6-02  kp per brenda disk is good. 6/2   LAPAROSCOPIC VAGINAL HYSTERECTOMY WITH SALPINGO OOPHORECTOMY Bilateral 01/29/2020   Procedure: LAPAROSCOPIC ASSISTED VAGINAL HYSTERECTOMY WITH SALPINGO OOPHORECTOMY;  Surgeon: Everlene Farrier, MD;  Location: [place];  Service: Gynecology;  Laterality: Bilateral;  need bed   MAXILLARY ANTROSTOMY Bilateral 10/31/2021   Procedure: MAXILLARY ANTROSTOMY WITH TISSUE REMOVAL;  Surgeon: Beverly Gust, MD;  Location: [place];  Service: ENT;  Laterality: Bilateral;   NASAL TURBINATE REDUCTION Bilateral 10/31/2021   Procedure: TURBINATE REDUCTION/SUBMUCOSAL RESECTION;  Surgeon: Beverly Gust, MD;  Location: [place];  Service: ENT;  Laterality: Bilateral;   right knee surgery      right wrist surgery      SEPTOPLASTY N/A 10/31/2021   Procedure: SEPTOPLASTY;  Surgeon: Beverly Gust, MD;  Location: [place];  Service: ENT;  Laterality: N/A;   SMALL INTESTINE SURGERY     51 years old  SPHENOIDECTOMY Bilateral 10/31/2021   Procedure: SPHENOIDECTOMY;  Surgeon: Linus Salmons, MD;  Location: [facility];  Service: ENT;  Laterality: Bilateral;   torn meniscus right knee surgery      TUBAL LIGATION      OB History   No obstetric history on file.      Home Medications    Prior to Admission  medications   Medication Sig Start Date End Date Taking? Authorizing Provider  ELIQUIS 5 MG TABS tablet TAKE 2 TABLETS BY MOUTH TWICE A DAY FOR THE FIRST 7 DAYS, THEN TAKE 1 TABLET TWICE A DAY THEREAFTER Patient taking differently: Take 2.5 mg by mouth 2 (two) times daily. 03/11/22   Georgiana Spinner, NP  estradiol (ESTRACE) 2 MG tablet 1 tablet Patient not taking: Reported on 03/09/2022 04/22/20   [provider]  estradiol-norethindrone (ACTIVELLA) 1-0.5 MG tablet Take 1 tablet by mouth daily. 12/18/19   [provider]  EVENING PRIMROSE OIL PO Take by mouth daily.    [provider]  Fezolinetant (VEOZAH) 45 MG TABS Take 1 tablet by mouth daily. 02/09/22   [provider]  fluticasone (FLONASE) 50 MCG/ACT nasal spray Place into both nostrils daily. Patient not taking: Reported on 10/31/2021    [provider]  HYDROcodone-Acetaminophen 5-300 MG TABS Take 1-2 tablets by mouth every 4 (four) hours as needed. 10/31/21   Linus Salmons, MD  HYDROcodone-Acetaminophen 5-300 MG TABS Take 1-2 tablets by mouth every 4 (four) hours as needed. Patient not taking: Reported on 03/09/2022 10/31/21   Linus Salmons, MD  ibuprofen (ADVIL) 600 MG tablet Take 1 tablet (600 mg total) by mouth every 6 (six) hours as needed for moderate pain. Patient not taking: Reported on 03/18/2020 01/30/20   Harold Hedge, MD  lisdexamfetamine (VYVANSE) 60 MG capsule Take 60 mg by mouth daily.  08/19/14   [provider]  Melatonin 10 MG TABS Take by mouth as needed. At bedtime    [provider]  methocarbamol (ROBAXIN) 500 MG tablet Take 1 tablet (500 mg total) by mouth every 6 (six) hours as needed for muscle spasms. 01/30/20   Harold Hedge, MD  metoprolol succinate (TOPROL-XL) 25 MG 24 hr tablet Take 25 mg by mouth daily.    [provider]  montelukast (SINGULAIR) 10 MG tablet Take 10 mg by mouth at bedtime.  01/09/15   [provider]   montelukast (SINGULAIR) 10 MG tablet Take 10 mg by mouth at bedtime.    [provider]  OVER THE COUNTER MEDICATION Magnesium citrate gummy 2 po daily    [provider]    Family History Family History  Problem Relation Age of Onset   Hypertension Father     Social History Social History   Tobacco Use   Smoking status: Never   Smokeless tobacco: Never  Vaping Use   Vaping Use: Never used  Substance Use Topics   Alcohol use: Yes    Comment: Social drinker    Drug use: No     Allergies   Clindamycin/lincomycin, Hydrocodone, Bactroban [mupirocin], Benzalkonium chloride, Neosporin [bacitracin-polymyxin b], Oxycontin [oxycodone], Sulfa antibiotics, and Wound dressing adhesive   Review of Systems Review of Systems  Constitutional:  Negative for chills and fever.  Gastrointestinal:  Negative for abdominal pain, nausea and vomiting.  Genitourinary:  Positive for dysuria and frequency. Negative for flank pain, hematuria, pelvic pain and vaginal discharge.  Skin:  Negative for rash.  All other systems reviewed and are negative.  Physical Exam Triage Vital Signs ED Triage Vitals  Enc Vitals Group     BP      Pulse      Resp      Temp      Temp src      SpO2      Weight      Height      Head Circumference      Peak Flow      Pain Score      Pain Loc      Pain Edu?      Excl. in [place]?    No data found.  Updated Vital Signs BP 134/83   Pulse 96   Temp 98.1 F (36.7 C)   Resp 18   Ht 5\' 3"  (1.6 m)   Wt 139 lb (63 kg)   SpO2 99%   BMI 24.62 kg/m   Visual Acuity Right Eye Distance:   Left Eye Distance:   Bilateral Distance:    Right Eye Near:   Left Eye Near:    Bilateral Near:     Physical Exam Vitals and nursing note reviewed.  Constitutional:      General: She is not in acute distress.    Appearance: Normal appearance. She is well-developed. She is not ill-appearing.  HENT:     Mouth/Throat:     Mouth: Mucous membranes  are moist.  Cardiovascular:     Rate and Rhythm: Normal rate and regular rhythm.     Heart sounds: Normal heart sounds.  Pulmonary:     Effort: Pulmonary effort is normal. No respiratory distress.     Breath sounds: Normal breath sounds.  Abdominal:     General: Bowel sounds are normal.     Palpations: Abdomen is soft.     Tenderness: There is no abdominal tenderness. There is no right CVA tenderness, left CVA tenderness, guarding or rebound.  Musculoskeletal:     Cervical back: Neck supple.  Skin:    General: Skin is warm and dry.  Neurological:     Mental Status: She is alert.  Psychiatric:        Mood and Affect: Mood normal.        Behavior: Behavior normal.      UC Treatments / Results  Labs (all labs ordered are listed, but only abnormal results are displayed) Labs Reviewed  POCT URINALYSIS DIP (MANUAL ENTRY) - Abnormal; Notable for the following components:      Result Value   Color, UA green (*)    Bilirubin, UA small (*)    Blood, UA trace-lysed (*)    All other components within normal limits  URINE CULTURE    EKG   Radiology No results found.  Procedures Procedures (including critical care time)  Medications Ordered in UC Medications - No data to display  Initial Impression / Assessment and Plan / UC Course  I have reviewed the triage vital signs and the nursing notes.  Pertinent labs & imaging results that were available during my care of the patient were reviewed by me and considered in my medical decision making (see chart for details).    Dysuria.  Urine culture pending. Discussed with patient that we will call her if the urine culture shows the need for treatment. Instructed her to follow-up with her PCP if her symptoms are not improving. Patient agrees to plan of care.     Final Clinical Impressions(s) / UC Diagnoses   Final diagnoses:  Dysuria  Discharge Instructions      The urine culture is pending.  Follow up with your primary  care provider if your symptoms are not improving.        ED Prescriptions   None    PDMP not reviewed this encounter.   Sharion Balloon, NP 03/29/22 [phone]

## 2022-03-30 LAB — URINE CULTURE: Culture: NO GROWTH

## 2022-04-23 ENCOUNTER — Encounter: Payer: Self-pay | Admitting: Unknown Physician Specialty

## 2022-04-23 ENCOUNTER — Encounter (INDEPENDENT_AMBULATORY_CARE_PROVIDER_SITE_OTHER): Payer: Self-pay | Admitting: Vascular Surgery

## 2022-05-01 ENCOUNTER — Ambulatory Visit: Payer: BC Managed Care – PPO | Admitting: Anesthesiology

## 2022-05-01 ENCOUNTER — Ambulatory Visit
Admission: RE | Admit: 2022-05-01 | Discharge: 2022-05-01 | Disposition: A | Discharge status: Home / Self Care | Payer: BC Managed Care – PPO | Attending: Unknown Physician Specialty | Admitting: Unknown Physician Specialty

## 2022-05-01 ENCOUNTER — Other Ambulatory Visit: Payer: Self-pay

## 2022-05-01 ENCOUNTER — Encounter: Payer: Self-pay | Admitting: Unknown Physician Specialty

## 2022-05-01 ENCOUNTER — Encounter
Admission: RE | Disposition: A | Discharge status: Home / Self Care | Payer: Self-pay | Source: Home / Self Care | Attending: Unknown Physician Specialty

## 2022-05-01 DIAGNOSIS — J3489 Other specified disorders of nose and nasal sinuses: Secondary | ICD-10-CM | POA: Diagnosis not present

## 2022-05-01 DIAGNOSIS — J338 Other polyp of sinus: Secondary | ICD-10-CM | POA: Insufficient documentation

## 2022-05-01 DIAGNOSIS — I499 Cardiac arrhythmia, unspecified: Secondary | ICD-10-CM | POA: Diagnosis not present

## 2022-05-01 DIAGNOSIS — Z86718 Personal history of other venous thrombosis and embolism: Secondary | ICD-10-CM | POA: Diagnosis not present

## 2022-05-01 DIAGNOSIS — J45909 Unspecified asthma, uncomplicated: Secondary | ICD-10-CM | POA: Insufficient documentation

## 2022-05-01 HISTORY — DX: Presence of spectacles and contact lenses: Z97.3

## 2022-05-01 HISTORY — PX: NASAL ENDOSCOPY: SHX6577

## 2022-05-01 SURGERY — ENDOSCOPY, NOSE
Anesthesia: General | Laterality: Bilateral

## 2022-05-01 MED ORDER — OXYMETAZOLINE HCL 0.05 % NA SOLN
6.0000 | Freq: Once | NASAL | Status: AC
  Administered 2022-05-01: 6 via NASAL

## 2022-05-01 MED ORDER — LACTATED RINGERS IV SOLN: INTRAVENOUS | Status: DC

## 2022-05-01 MED ORDER — DIPHENHYDRAMINE HCL 25 MG PO CAPS
25.0000 mg | ORAL_CAPSULE | Freq: Once | ORAL | Status: DC | PRN
Start: 2022-05-01 — End: 2022-05-01

## 2022-05-01 MED ORDER — MIDAZOLAM HCL 2 MG/2ML IJ SOLN
INTRAMUSCULAR | Status: DC | PRN
  Administered 2022-05-01: 2 mg via INTRAVENOUS

## 2022-05-01 MED ORDER — LIDOCAINE-EPINEPHRINE 1 %-1:100000 IJ SOLN
INTRAMUSCULAR | Status: DC | PRN
  Administered 2022-05-01: 2 mL

## 2022-05-01 MED ORDER — SUCCINYLCHOLINE CHLORIDE 200 MG/10ML IV SOSY
PREFILLED_SYRINGE | INTRAVENOUS | Status: DC | PRN
  Administered 2022-05-01: 100 mg via INTRAVENOUS

## 2022-05-01 MED ORDER — LIDOCAINE HCL (PF) 1 % IJ SOLN: INTRAMUSCULAR | Status: DC | PRN

## 2022-05-01 MED ORDER — PHENYLEPHRINE HCL 0.5 % NA SOLN
NASAL | Status: DC | PRN
  Administered 2022-05-01: 2 mL via NASAL

## 2022-05-01 MED ORDER — DEXAMETHASONE SODIUM PHOSPHATE 10 MG/ML IJ SOLN
INTRAMUSCULAR | Status: DC | PRN
  Administered 2022-05-01: 8 mg via INTRAVENOUS

## 2022-05-01 MED ORDER — SCOPOLAMINE 1 MG/3DAYS TD PT72
1.0000 | MEDICATED_PATCH | TRANSDERMAL | Status: DC
Start: 2022-05-01 — End: 2022-05-01
  Administered 2022-05-01: 1.5 mg via TRANSDERMAL

## 2022-05-01 MED ORDER — FENTANYL CITRATE (PF) 100 MCG/2ML IJ SOLN
INTRAMUSCULAR | Status: DC | PRN
  Administered 2022-05-01: 50 ug via INTRAVENOUS

## 2022-05-01 MED ORDER — OXYCODONE HCL 5 MG/5ML PO SOLN: 5.0000 mg | Freq: Once | ORAL | Status: DC | PRN

## 2022-05-01 MED ORDER — OXYCODONE HCL 5 MG PO TABS: 5.0000 mg | ORAL_TABLET | Freq: Once | ORAL | Status: DC | PRN

## 2022-05-01 MED ORDER — ONDANSETRON HCL 4 MG/2ML IJ SOLN
INTRAMUSCULAR | Status: DC | PRN
  Administered 2022-05-01: 4 mg via INTRAVENOUS

## 2022-05-01 MED ORDER — PROPOFOL 10 MG/ML IV BOLUS
INTRAVENOUS | Status: DC | PRN
  Administered 2022-05-01: 150 mg via INTRAVENOUS

## 2022-05-01 MED ORDER — HYDROMORPHONE HCL 1 MG/ML IJ SOLN: 0.2500 mg | INTRAMUSCULAR | Status: DC | PRN

## 2022-05-01 SURGICAL SUPPLY — 30 items
APPLICATOR COTTON TIP 6IN STRL (MISCELLANEOUS) ×2 IMPLANT
BASIN GRAD PLASTIC 32OZ STRL (MISCELLANEOUS) ×1 IMPLANT
BLADE SURG 15 STRL LF DISP TIS (BLADE) ×1 IMPLANT
BLADE SURG 15 STRL SS (BLADE) ×1
CANISTER SUCT 1200ML W/VALVE (MISCELLANEOUS) ×1 IMPLANT
COVER MAYO STAND STRL (DRAPES) ×1 IMPLANT
CUP MEDICINE 2OZ PLAST GRAD ST (MISCELLANEOUS) ×2 IMPLANT
DRAPE HEAD BAR (DRAPES) IMPLANT
DRAPE SHEET LG 3/4 BI-LAMINATE (DRAPES) IMPLANT
DRESSING NASL FOAM PST OP SINU (MISCELLANEOUS) IMPLANT
DRSG NASAL FOAM POST OP SINU (MISCELLANEOUS) ×1
ELECT REM PT RETURN 9FT ADLT (ELECTROSURGICAL) ×1
ELECTRODE REM PT RTRN 9FT ADLT (ELECTROSURGICAL) IMPLANT
GAUZE SPONGE 4X4 12PLY STRL (GAUZE/BANDAGES/DRESSINGS) IMPLANT
GLOVE SURG ENC TEXT LTX SZ7.5 (GLOVE) ×2 IMPLANT
GOWN STRL REUS W/ TWL LRG LVL3 (GOWN DISPOSABLE) IMPLANT
GOWN STRL REUS W/TWL LRG LVL3 (GOWN DISPOSABLE)
KIT TURNOVER KIT A (KITS) ×1 IMPLANT
MARKER SKIN DUAL TIP RULER LAB (MISCELLANEOUS) ×1 IMPLANT
NDL HYPO 25GX1X1/2 BEV (NEEDLE) ×1 IMPLANT
NEEDLE HYPO 25GX1X1/2 BEV (NEEDLE) ×1 IMPLANT
NS IRRIG 500ML POUR BTL (IV SOLUTION) ×1 IMPLANT
PACK ENT CUSTOM (PACKS) IMPLANT
SPONGE NEURO XRAY DETECT 1X3 (DISPOSABLE) ×1 IMPLANT
SUT PLAIN GUT 4-0 (SUTURE) ×1 IMPLANT
SYR 10ML LL (SYRINGE) ×1 IMPLANT
SYR BULB IRRIG 60ML STRL (SYRINGE) IMPLANT
TOWEL OR 17X26 4PK STRL BLUE (TOWEL DISPOSABLE) ×1 IMPLANT
TUBING CONN 6MMX3.1M (TUBING) ×1
TUBING SUCTION CONN 0.25 STRL (TUBING) ×1 IMPLANT

## 2022-05-01 NOTE — Op Note (Signed)
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  Surg:  Davina Poke  Anes:  GOT  EBL: Less than 5 cc  Comp: None  Findings: Synechiae between polypoid mass middle turbinate and septum, polypoid mass emanating from the anterior portion of the middle turbinate.  Previous sinus surgery no evidence of recurrent polyps septum midline inferior turbinates previously reduced  Procedure: [name] was identified in the holding area taken the operating placed in supine position.  After general and tracheal anesthesia tube was turned 90 degrees.  Cottonoid pledget with phenylephrine lidocaine solution was placed within each nostril 5 anesthesia removed.  A local anesthetic 1% lidocaine with 1 1000 use of epinephrine was used to inject the polypoid mass on the right-hand side which had an adhesion to the nasal septum.  Total of 2 cc was used.  Patient was draped in usual fashion for sinus surgery.  0 degree endoscope was brought into the field.  Examination of the left nostril showed was widely patent sinuses were patent no evidence of significant sinus disease.  On the right-hand side there appeared to be a reparative polypoid mass the anterior portion of the middle turbinate.  There was also a synechiae between this area of the turbinate and the septum.  The synechiae between the tube was lysed using the sickle knife.  Polypoid mass was rather pendulous and was falling laterally almost obstructing the previous maxillary antrostomy region.  Was also obstructing the ethmoid region as well.  Therefore endoscopic scissors were used to excise this polypoid mass from the area adjacent to the middle turbinate on the right side.  Polypoid mass removed this gave  excellent visualization into the maxillary sinus area and to the ethmoid region.  Suction cautery was then used to cauterize the inferior portion of the middle turbinate from which it had been excised.  This opt any using which was visualized.  Examination of the nose showed it to be widely patent septum remains midline and intact the airway was patent.  No active bleeding Stammberger gel was then placed within the nasal cavity.  Patient was then returned to anesthesia where she was awakened in the operating room taken care of in stable condition.  Specimen: Right middle turbinate polypoid mass  Dispo:   Good  Plan: Discharge to home follow-up 10 days  Davina Poke  05/01/2022 10:17 AM

## 2022-05-01 NOTE — H&P (Signed)
The patient's history has been reviewed, patient examined, no change in status, stable for surgery.  Questions were answered to the patients satisfaction.  

## 2022-05-01 NOTE — Anesthesia Procedure Notes (Signed)
Procedure Name: Intubation Date/Time: 05/01/2022 9:54 AM  Performed by: Jonna Clark, CRNAPre-anesthesia Checklist: Patient identified, Patient being monitored, Timeout performed, Emergency Drugs available and Suction available Patient Re-evaluated:Patient Re-evaluated prior to induction Oxygen Delivery Method: Circle system utilized Preoxygenation: Pre-oxygenation with 100% oxygen Induction Type: IV induction Ventilation: Mask ventilation without difficulty Laryngoscope Size: Mac and 3 Grade View: Grade II Tube type: Oral Rae Tube size: 6.5 mm Number of attempts: 1 Placement Confirmation: ETT inserted through vocal cords under direct vision, positive ETCO2 and breath sounds checked- equal and bilateral Secured at: 21 cm Tube secured with: Tape Dental Injury: Teeth and Oropharynx as per pre-operative assessment

## 2022-05-01 NOTE — Anesthesia Postprocedure Evaluation (Signed)
Anesthesia Post Note  Patient: Claudia Hawkins  Procedure(s) Performed: NASAL ENDOSCOPY WITH EXCISION OF  REPARATVE GRANLATION TISSUE WITH SYNECHIA (Bilateral)  Patient location during evaluation: PACU Anesthesia Type: General Level of consciousness: awake and alert Pain management: pain level controlled Vital Signs Assessment: post-procedure vital signs reviewed and stable Respiratory status: spontaneous breathing, nonlabored ventilation, respiratory function stable and patient connected to nasal cannula oxygen Cardiovascular status: blood pressure returned to baseline and stable Postop Assessment: no apparent nausea or vomiting Anesthetic complications: no   No notable events documented.   Last Vitals:  Vitals:   05/01/22 1026 05/01/22 1050  BP: 113/74 113/73  Pulse: 76   Resp: 18   Temp: 36.5 C   SpO2: 97%     Last Pain:  Vitals:   05/01/22 1050  TempSrc:   PainSc: 0-No pain                 Louie Boston

## 2022-05-01 NOTE — Transfer of Care (Signed)
Immediate Anesthesia Transfer of Care Note  Patient: Claudia Hawkins  Procedure(s) Performed: NASAL ENDOSCOPY WITH EXCISION OF  REPARATVE GRANLATION TISSUE WITH SYNECHIA (Bilateral)  Patient Location: PACU  Anesthesia Type:General  Level of Consciousness: drowsy and patient cooperative  Airway & Oxygen Therapy: Patient Spontanous Breathing  Post-op Assessment: Report given to RN and Post -op Vital signs reviewed and stable  Post vital signs: Reviewed and stable  Last Vitals:  Vitals Value Taken Time  BP 103/73 05/01/22 1030  Temp    Pulse 73 05/01/22 1031  Resp 10 05/01/22 1031  SpO2 99 % 05/01/22 1031  Vitals shown include unvalidated device data.  Last Pain:  Vitals:   05/01/22 0813  TempSrc: Temporal  PainSc: 0-No pain         Complications: No notable events documented.

## 2022-05-01 NOTE — Anesthesia Preprocedure Evaluation (Addendum)
Anesthesia Evaluation  Patient identified by MRN, date of birth, ID band Patient awake    Reviewed: Allergy & Precautions, NPO status , Patient's Chart, lab work & pertinent test results  Airway Mallampati: III  TM Distance: >3 FB Neck ROM: full    Dental no notable dental hx.    Pulmonary asthma    Pulmonary exam normal        Cardiovascular Normal cardiovascular exam+ dysrhythmias      Neuro/Psych negative neurological ROS  negative psych ROS   GI/Hepatic negative GI ROS, Neg liver ROS,,,  Endo/Other  negative endocrine ROS    Renal/GU      Musculoskeletal   Abdominal   Peds  Hematology negative hematology ROS (+) Hx of DVT on Eliquis    Anesthesia Other Findings Past Medical History: No date: Adult ADHD No date: Allergic rhinitis No date: Asthma     Comment:  very rarely uses albuterol  12/05/2019: COVID-19     Comment:  Also 04/19/22 - resolved 11/18/2021: DVT (deep venous thrombosis) ([facility])     Comment:  Right calf No date: Dysrhythmia     Comment:  tachycardia  No date: History of kidney stones No date: Wears contact lenses  Past Surgical History: 01/29/2020: ANTERIOR AND POSTERIOR REPAIR; N/A     Comment:  Procedure: ANTERIOR (CYSTOCELE) AND POSTERIOR REPAIR               (RECTOCELE) SACROSPINOUS LIGAMENT SUSPENSION;  Surgeon:               Harold Hedge, MD;  Location: [facility];  Service: Gynecology;  Laterality: N/A; 01/29/2020: BLADDER SUSPENSION; N/A     Comment:  Procedure: Transobturator sling;  Surgeon: Harold Hedge, MD;  Location: [facility];                Service: Gynecology;  Laterality: N/A; 11/18/2020: COLONOSCOPY WITH PROPOFOL; N/A     Comment:  Procedure: COLONOSCOPY WITH PROPOFOL;  Surgeon: Midge Minium, MD;  Location: [facility];  Service:               Endoscopy;  Laterality: N/A; 10/31/2021:  ETHMOIDECTOMY; Bilateral     Comment:  Procedure: ETHMOIDECTOMY;  Surgeon: Linus Salmons,               MD;  Location: [facility];  Service: ENT;                Laterality: Bilateral; 10/31/2021: FRONTAL SINUS EXPLORATION; Bilateral     Comment:  Procedure: FRONTAL SINUS EXPLORATION;  Surgeon: Linus Salmons, MD;  Location: [facility];  Service:               ENT;  Laterality: Bilateral; 10/31/2021: IMAGE GUIDED SINUS SURGERY; N/A     Comment:  Procedure: IMAGE GUIDED SINUS SURGERY;  Surgeon:               Linus Salmons, MD;  Location: [facility];                Service: ENT;  Laterality: N/A;  placed disk on OR charge  nurse desk 6-02  kp per brenda disk is good. 6/2 01/29/2020: LAPAROSCOPIC VAGINAL HYSTERECTOMY WITH SALPINGO  OOPHORECTOMY; Bilateral     Comment:  Procedure: LAPAROSCOPIC ASSISTED VAGINAL HYSTERECTOMY               WITH SALPINGO OOPHORECTOMY;  Surgeon: Harold Hedge, MD;              Location: [facility];  Service:               Gynecology;  Laterality: Bilateral;  need bed 10/31/2021: MAXILLARY ANTROSTOMY; Bilateral     Comment:  Procedure: MAXILLARY ANTROSTOMY WITH TISSUE REMOVAL;                Surgeon: Linus Salmons, MD;  Location: [facility];  Service: ENT;  Laterality: Bilateral; 10/31/2021: NASAL TURBINATE REDUCTION; Bilateral     Comment:  Procedure: TURBINATE REDUCTION/SUBMUCOSAL RESECTION;                Surgeon: Linus Salmons, MD;  Location: [facility];  Service: ENT;  Laterality: Bilateral; No date: right knee surgery  No date: right wrist surgery  10/31/2021: SEPTOPLASTY; N/A     Comment:  Procedure: SEPTOPLASTY;  Surgeon: Linus Salmons, MD;               Location: [facility];  Service: ENT;                Laterality: N/A; No date: SMALL INTESTINE SURGERY     Comment:  51 years old 10/31/2021: SPHENOIDECTOMY; Bilateral      Comment:  Procedure: SPHENOIDECTOMY;  Surgeon: Linus Salmons,               MD;  Location: [facility];  Service: ENT;                Laterality: Bilateral; No date: torn meniscus right knee surgery  No date: TUBAL LIGATION  BMI    Body Mass Index: 23.03 kg/m      Reproductive/Obstetrics negative OB ROS                             Anesthesia Physical Anesthesia Plan  ASA: 2  Anesthesia Plan: General ETT   Post-op Pain Management: Toradol IV (intra-op), Ofirmev IV (intra-op) and Oxycodone PO   Induction: Intravenous  PONV Risk Score and Plan: Ondansetron, Dexamethasone, Midazolam and Treatment may vary due to age or medical condition  Airway Management Planned: Oral ETT  Additional Equipment:   Intra-op Plan:   Post-operative Plan: Extubation in OR  Informed Consent: I have reviewed the patients History and Physical, chart, labs and discussed the procedure including the risks, benefits and alternatives for the proposed anesthesia with the patient or authorized representative who has indicated his/her understanding and acceptance.     Dental Advisory Given  Plan Discussed with: Anesthesiologist, CRNA and Surgeon  Anesthesia Plan Comments: (Patient consented for risks of anesthesia including but not limited to:  - adverse reactions to medications - damage to eyes, teeth, lips or other oral mucosa - nerve damage due to positioning  - sore throat or hoarseness - Damage to heart, brain, nerves, lungs, other parts of body or loss of life  Patient voiced understanding.)  Anesthesia Quick Evaluation  

## 2022-05-05 ENCOUNTER — Encounter: Payer: Self-pay | Admitting: Unknown Physician Specialty

## 2022-05-05 LAB — SURGICAL PATHOLOGY

## 2022-06-07 NOTE — Progress Notes (Signed)
MRN : [id number]  Claudia Hawkins is a 52 y.o. (05-23-1970) female who presents with chief complaint of legs hurt and swell.  History of Present Illness:   The patient presents to the office for evaluation of DVT.  DVT was identified at [facility] by Duplex ultrasound.  The initial symptoms were pain and swelling in the calf of the right lower extremity.  Of note the patient underwent sinus surgery October 31, 2021.  It was at a routine follow-up with Dr. Jenne Campus that she commented her calf had been tight and somewhat painful particularly with standing.  This is what prompted the ultrasound demonstrating the peroneal thrombus.   Patient's previous duplex ultrasound of the venous system showed DVT in the peroneal veins.   The patient is on anticoagulation, Eliquis    The patient notes the leg continues to be very painful with dependency and swells quite a bite.  Symptoms are much better with elevation.  The patient notes minimal edema in the morning which steadily worsens throughout the day.     The patient has been using compression therapy.   No SOB or pleuritic chest pains.  No cough or hemoptysis.   No blood per rectum or blood in any sputum.  No excessive bruising per the patient.    No recent shortening of the patient's walking distance or new symptoms consistent with claudication.  No history of rest pain symptoms. No new ulcers or wounds of the lower extremities have occurred.   Duplex ultrasound of the venous system of the right leg today shows complete resolution of the previous DVT, normal venous duplex.  No outpatient medications have been marked as taking for the 06/08/22 encounter (Appointment) with Gilda Crease, Latina Craver, MD.    Past Medical History:  Diagnosis Date   Adult ADHD    Allergic rhinitis    Asthma    very rarely uses albuterol    COVID-19 12/05/2019   Also 04/19/22 - resolved   DVT (deep venous thrombosis) (HCC) 11/18/2021   Right calf   Dysrhythmia     tachycardia    History of kidney stones    Wears contact lenses     Past Surgical History:  Procedure Laterality Date   ANTERIOR AND POSTERIOR REPAIR N/A 01/29/2020   Procedure: ANTERIOR (CYSTOCELE) AND POSTERIOR REPAIR (RECTOCELE) SACROSPINOUS LIGAMENT SUSPENSION;  Surgeon: Harold Hedge, MD;  Location: [facility];  Service: Gynecology;  Laterality: N/A;   BLADDER SUSPENSION N/A 01/29/2020   Procedure: Transobturator sling;  Surgeon: Harold Hedge, MD;  Location: [facility];  Service: Gynecology;  Laterality: N/A;   COLONOSCOPY WITH PROPOFOL N/A 11/18/2020   Procedure: COLONOSCOPY WITH PROPOFOL;  Surgeon: Midge Minium, MD;  Location: [facility];  Service: Endoscopy;  Laterality: N/A;   ETHMOIDECTOMY Bilateral 10/31/2021   Procedure: ETHMOIDECTOMY;  Surgeon: Linus Salmons, MD;  Location: [facility];  Service: ENT;  Laterality: Bilateral;   FRONTAL SINUS EXPLORATION Bilateral 10/31/2021   Procedure: FRONTAL SINUS EXPLORATION;  Surgeon: Linus Salmons, MD;  Location: [facility];  Service: ENT;  Laterality: Bilateral;   IMAGE GUIDED SINUS SURGERY N/A 10/31/2021   Procedure: IMAGE GUIDED SINUS SURGERY;  Surgeon: Linus Salmons, MD;  Location: [facility];  Service: ENT;  Laterality: N/A;  placed disk on OR charge nurse desk 6-02  kp per brenda disk is good. 6/2   LAPAROSCOPIC VAGINAL HYSTERECTOMY WITH SALPINGO OOPHORECTOMY Bilateral 01/29/2020   Procedure: LAPAROSCOPIC  ASSISTED VAGINAL HYSTERECTOMY WITH SALPINGO OOPHORECTOMY;  Surgeon: Everlene Farrier, MD;  Location: [facility];  Service: Gynecology;  Laterality: Bilateral;  need bed   MAXILLARY ANTROSTOMY Bilateral 10/31/2021   Procedure: MAXILLARY ANTROSTOMY WITH TISSUE REMOVAL;  Surgeon: Beverly Gust, MD;  Location: [place];  Service: ENT;  Laterality: Bilateral;   NASAL ENDOSCOPY Bilateral 05/01/2022   Procedure: NASAL ENDOSCOPY WITH EXCISION  OF  REPARATVE GRANLATION TISSUE WITH SYNECHIA;  Surgeon: Beverly Gust, MD;  Location: [place];  Service: ENT;  Laterality: Bilateral;   NASAL TURBINATE REDUCTION Bilateral 10/31/2021   Procedure: TURBINATE REDUCTION/SUBMUCOSAL RESECTION;  Surgeon: Beverly Gust, MD;  Location: [place];  Service: ENT;  Laterality: Bilateral;   right knee surgery      right wrist surgery      SEPTOPLASTY N/A 10/31/2021   Procedure: SEPTOPLASTY;  Surgeon: Beverly Gust, MD;  Location: [place];  Service: ENT;  Laterality: N/A;   SMALL INTESTINE SURGERY     52 years old   SPHENOIDECTOMY Bilateral 10/31/2021   Procedure: SPHENOIDECTOMY;  Surgeon: Beverly Gust, MD;  Location: [place];  Service: ENT;  Laterality: Bilateral;   torn meniscus right knee surgery      TUBAL LIGATION      Social History Social History   Tobacco Use   Smoking status: Never   Smokeless tobacco: Never  Vaping Use   Vaping Use: Never used  Substance Use Topics   Alcohol use: Yes    Comment: Social drinker    Drug use: No    Family History Family History  Problem Relation Age of Onset   Hypertension Father     Allergies  Allergen Reactions   Clindamycin/Lincomycin Hives    GI upset   Hydrocodone Itching   Bactroban [Mupirocin] Itching and Rash    At application site    Benzalkonium Chloride Rash   Capsicum Annuum Extract & Derivative (Bell Pepper) [Capsicum] Rash    Jalapeno peppers   Neosporin [Bacitracin-Polymyxin B] Itching and Rash    At application site   Oxycontin [Oxycodone] Hives, Itching and Rash   Pineapple Rash   Sulfa Antibiotics Hives, Diarrhea, Nausea And Vomiting and Rash   Wound Dressing Adhesive Itching and Rash     REVIEW OF SYSTEMS (Negative unless checked)  Constitutional: [] Weight loss  [] Fever  [] Chills Cardiac: [] Chest pain   [] Chest pressure   [] Palpitations   [] Shortness of breath when laying flat   [] Shortness of breath with  exertion. Vascular:  [] Pain in legs with walking   [x] Pain in legs at rest  [x] History of DVT   [] Phlebitis   [x] Swelling in legs   [] Varicose veins   [] Non-healing ulcers Pulmonary:   [] Uses home oxygen   [] Productive cough   [] Hemoptysis   [] Wheeze  [] COPD   [x] Asthma Neurologic:  [] Dizziness   [] Seizures   [] History of stroke   [] History of TIA  [] Aphasia   [] Vissual changes   [] Weakness or numbness in arm   [] Weakness or numbness in leg Musculoskeletal:   [] Joint swelling   [] Joint pain   [] Low back pain Hematologic:  [] Easy bruising  [] Easy bleeding   [] Hypercoagulable state   [] Anemic Gastrointestinal:  [] Diarrhea   [] Vomiting  [] Gastroesophageal reflux/heartburn   [] Difficulty swallowing. Genitourinary:  [] Chronic kidney disease   [] Difficult urination  [] Frequent urination   [] Blood in urine Skin:  [] Rashes   [] Ulcers  Psychological:  [] History of anxiety   []  History of major depression.  Physical Examination  There were no vitals filed for this visit. There is no height or weight on file to calculate BMI. Gen: WD/WN, NAD Head: [place]/AT, No temporalis wasting.  Ear/Nose/Throat: Hearing grossly intact, nares w/o erythema or drainage, pinna without lesions Eyes: PER, EOMI, sclera nonicteric.  Neck: Supple, no gross masses.  No JVD.  Pulmonary:  Good air movement, no audible wheezing, no use of accessory muscles.  Cardiac: RRR, precordium not hyperdynamic. Vascular:  scattered varicosities present bilaterally.  Very mild venous stasis changes to the legs bilaterally.  trace soft pitting edema  Vessel Right Left  Radial Palpable Palpable  Gastrointestinal: soft, non-distended. No guarding/no peritoneal signs.  Musculoskeletal: M/S 5/5 throughout.  No deformity.  Neurologic: CN 2-12 intact. Pain and light touch intact in extremities.  Symmetrical.  Speech is fluent. Motor exam as listed above. Psychiatric: Judgment intact, Mood & affect appropriate for pt's clinical  situation. Dermatologic: Venous rashes no ulcers noted.  No changes consistent with cellulitis. Lymph : No lichenification or skin changes of chronic lymphedema.  CBC Lab Results  Component Value Date   WBC 11.5 (H) 01/30/2020   HGB 11.7 (L) 01/30/2020   HCT 34.0 (L) 01/30/2020   MCV 90.4 01/30/2020   PLT 230 01/30/2020    BMET No results found for: "NA", "K", "CL", "CO2", "GLUCOSE", "BUN", "CREATININE", "CALCIUM", "GFRNONAA", "GFRAA" CrCl cannot be calculated (No successful lab value found.).  COAG No results found for: "INR", "PROTIME"  Radiology No results found.   Assessment/Plan 1. Acute deep vein thrombosis (DVT) of right peroneal vein (HCC) Recommend:    No surgery or intervention at this point in time.  IVC filter is not indicated at present.   Patient's duplex ultrasound of the venous system shows resolution of DVT on the right .   The patient is initiated on anticoagulation    Elevation was stressed, such as the use of a recliner.   I have reviewed my discussion regarding DVT and post phlebitic changes such as swelling and why it  causes symptoms such as pain.  The patient should wear graduated compression stockings beginning after three full days of anticoagulation.  The compression should be worn on a daily basis. The patient should wearing the stockings first thing in the morning and removing them in the evening. The patient should not to sleep in the stockings.  In addition, behavioral modification including elevation during the day and avoidance of prolonged dependency will be initiated.     The patient has completed anticoagulation.  There is no evidence of residual DVT or chronic changes as she has a normal scan.  She will follow-up with me as needed.  2. Uncomplicated asthma, unspecified asthma severity, unspecified whether persistent Continue pulmonary medications and aerosols as already ordered, these medications have been reviewed and there are no  changes at this time.     Hortencia Pilar, MD  06/07/2022 1:01 PM

## 2022-06-08 ENCOUNTER — Ambulatory Visit (INDEPENDENT_AMBULATORY_CARE_PROVIDER_SITE_OTHER): Payer: BC Managed Care – PPO

## 2022-06-08 ENCOUNTER — Ambulatory Visit (INDEPENDENT_AMBULATORY_CARE_PROVIDER_SITE_OTHER): Payer: BC Managed Care – PPO | Admitting: Vascular Surgery

## 2022-06-08 ENCOUNTER — Encounter (INDEPENDENT_AMBULATORY_CARE_PROVIDER_SITE_OTHER): Payer: Self-pay | Admitting: Vascular Surgery

## 2022-06-08 VITALS — BP 123/79 | HR 80 | Ht 63.0 in | Wt 138.0 lb

## 2022-06-08 DIAGNOSIS — J45909 Unspecified asthma, uncomplicated: Secondary | ICD-10-CM

## 2022-06-08 DIAGNOSIS — I82451 Acute embolism and thrombosis of right peroneal vein: Secondary | ICD-10-CM

## 2022-06-13 ENCOUNTER — Encounter (INDEPENDENT_AMBULATORY_CARE_PROVIDER_SITE_OTHER): Payer: Self-pay | Admitting: Vascular Surgery

## 2022-09-28 ENCOUNTER — Other Ambulatory Visit (INDEPENDENT_AMBULATORY_CARE_PROVIDER_SITE_OTHER): Payer: Self-pay | Admitting: Vascular Surgery

## 2022-09-28 ENCOUNTER — Telehealth (INDEPENDENT_AMBULATORY_CARE_PROVIDER_SITE_OTHER): Payer: Self-pay

## 2022-09-28 DIAGNOSIS — Z86718 Personal history of other venous thrombosis and embolism: Secondary | ICD-10-CM

## 2022-09-28 DIAGNOSIS — M79606 Pain in leg, unspecified: Secondary | ICD-10-CM

## 2022-09-28 NOTE — Telephone Encounter (Signed)
Can you schedule this please.  

## 2022-09-28 NOTE — Telephone Encounter (Signed)
Scheduled ABI for tomorrow morning 5.21.24 @ 8:00 AM

## 2022-09-29 ENCOUNTER — Ambulatory Visit (INDEPENDENT_AMBULATORY_CARE_PROVIDER_SITE_OTHER): Payer: BC Managed Care – PPO

## 2022-09-29 DIAGNOSIS — M79661 Pain in right lower leg: Secondary | ICD-10-CM

## 2022-09-29 DIAGNOSIS — M79606 Pain in leg, unspecified: Secondary | ICD-10-CM

## 2022-09-29 DIAGNOSIS — Z86718 Personal history of other venous thrombosis and embolism: Secondary | ICD-10-CM | POA: Diagnosis not present

## 2022-09-30 ENCOUNTER — Encounter (INDEPENDENT_AMBULATORY_CARE_PROVIDER_SITE_OTHER): Payer: Self-pay | Admitting: Vascular Surgery

## 2022-10-01 ENCOUNTER — Telehealth (INDEPENDENT_AMBULATORY_CARE_PROVIDER_SITE_OTHER): Payer: Self-pay

## 2022-10-01 NOTE — Telephone Encounter (Signed)
Claudia Hawkins called stating she had an ultrasound 09/29/22 and has not heard about her results. She is having muscle cramps, aching and tingling like she did before when she had a clot in her right leg..Patient also stated that she has started back taking her blood thinners on her own. After looking in her chart. I informed her that it says Schnier will be calling her results when he comes into the office. And she agree that the Ultrasound tech told her that he would call with the results, but didn't say when.

## 2022-10-01 NOTE — Telephone Encounter (Signed)
I just responded to the patient's my chart message to inform her of her results

## 2022-10-26 ENCOUNTER — Other Ambulatory Visit: Payer: Self-pay

## 2022-10-26 ENCOUNTER — Ambulatory Visit
Admission: RE | Admit: 2022-10-26 | Discharge: 2022-10-26 | Disposition: A | Discharge status: Home / Self Care | Payer: BC Managed Care – PPO | Source: Ambulatory Visit | Attending: Urgent Care | Admitting: Urgent Care

## 2022-10-26 VITALS — BP 126/84 | HR 82 | Temp 98.9°F | Resp 18

## 2022-10-26 DIAGNOSIS — L247 Irritant contact dermatitis due to plants, except food: Secondary | ICD-10-CM

## 2022-10-26 MED ORDER — PREDNISONE 10 MG PO TABS: ORAL_TABLET | ORAL | 0 refills | Status: AC

## 2022-10-26 NOTE — ED Triage Notes (Signed)
Complains of rash and itching to back and neck that wont go away with cortisone  Rash to back has been spreading 2-3 weeks

## 2022-10-26 NOTE — ED Provider Notes (Signed)
Claudia Hawkins    CSN: [id number] Arrival date & time: 10/26/22  1653      History   Chief Complaint Chief Complaint  Patient presents with   Allergic Reaction   Appointment    17:00    HPI Claudia Hawkins is a 52 y.o. female.    Allergic Reaction   Presents to urgent care with complaint of rash to her back and neck.  She states the rash has been present and spreading for 2 to 3 weeks.  She has been treating with topical cortisone.   She endorses "pulling weeds" and likely contact with environmental allergen.  She has plans to be evaluated by an allergist but cannot go for testing until the rash is resolved.  Past Medical History:  Diagnosis Date   Adult ADHD    Allergic rhinitis    Asthma    very rarely uses albuterol    COVID-19 12/05/2019   Also 04/19/22 - resolved   DVT (deep venous thrombosis) (HCC) 11/18/2021   Right calf   Dysrhythmia    tachycardia    History of kidney stones    Wears contact lenses     Patient Active Problem List   Diagnosis Date Noted   DVT (deep venous thrombosis) (HCC) 11/19/2021   Encounter for screening colonoscopy    Pelvic prolapse 01/29/2020   S/P LEEP (loop electrosurgical excision procedure) 10/27/2019   S/P endometrial ablation 10/27/2019   Tachycardia 10/26/2018   Palpitations 03/25/2015   Atrial tachycardia 03/25/2015   Asthma 03/25/2015   Attention deficit hyperactivity disorder 03/25/2015    Past Surgical History:  Procedure Laterality Date   ANTERIOR AND POSTERIOR REPAIR N/A 01/29/2020   Procedure: ANTERIOR (CYSTOCELE) AND POSTERIOR REPAIR (RECTOCELE) SACROSPINOUS LIGAMENT SUSPENSION;  Surgeon: Harold Hedge, MD;  Location: [facility];  Service: Gynecology;  Laterality: N/A;   BLADDER SUSPENSION N/A 01/29/2020   Procedure: Transobturator sling;  Surgeon: Harold Hedge, MD;  Location: [facility];  Service: Gynecology;  Laterality: N/A;   COLONOSCOPY WITH PROPOFOL N/A  11/18/2020   Procedure: COLONOSCOPY WITH PROPOFOL;  Surgeon: Midge Minium, MD;  Location: [facility];  Service: Endoscopy;  Laterality: N/A;   ETHMOIDECTOMY Bilateral 10/31/2021   Procedure: ETHMOIDECTOMY;  Surgeon: Linus Salmons, MD;  Location: [facility];  Service: ENT;  Laterality: Bilateral;   FRONTAL SINUS EXPLORATION Bilateral 10/31/2021   Procedure: FRONTAL SINUS EXPLORATION;  Surgeon: Linus Salmons, MD;  Location: [facility];  Service: ENT;  Laterality: Bilateral;   IMAGE GUIDED SINUS SURGERY N/A 10/31/2021   Procedure: IMAGE GUIDED SINUS SURGERY;  Surgeon: Linus Salmons, MD;  Location: [facility];  Service: ENT;  Laterality: N/A;  placed disk on OR charge nurse desk 6-02  kp per brenda disk is good. 6/2   LAPAROSCOPIC VAGINAL HYSTERECTOMY WITH SALPINGO OOPHORECTOMY Bilateral 01/29/2020   Procedure: LAPAROSCOPIC ASSISTED VAGINAL HYSTERECTOMY WITH SALPINGO OOPHORECTOMY;  Surgeon: Harold Hedge, MD;  Location: [facility] [place];  Service: Gynecology;  Laterality: Bilateral;  need bed   MAXILLARY ANTROSTOMY Bilateral 10/31/2021   Procedure: MAXILLARY ANTROSTOMY WITH TISSUE REMOVAL;  Surgeon: Linus Salmons, MD;  Location: [facility];  Service: ENT;  Laterality: Bilateral;   NASAL ENDOSCOPY Bilateral 05/01/2022   Procedure: NASAL ENDOSCOPY WITH EXCISION OF  REPARATVE GRANLATION TISSUE WITH SYNECHIA;  Surgeon: Linus Salmons, MD;  Location: [facility];  Service: ENT;  Laterality: Bilateral;   NASAL TURBINATE REDUCTION Bilateral 10/31/2021   Procedure: TURBINATE REDUCTION/SUBMUCOSAL RESECTION;  Surgeon: Jenne Campus,  Sibyl Parr, MD;  Location: [facility];  Service: ENT;  Laterality: Bilateral;   right knee surgery      right wrist surgery      SEPTOPLASTY N/A 10/31/2021   Procedure: SEPTOPLASTY;  Surgeon: Linus Salmons, MD;  Location: [facility];  Service: ENT;  Laterality: N/A;   SMALL INTESTINE SURGERY     52  years old   SPHENOIDECTOMY Bilateral 10/31/2021   Procedure: SPHENOIDECTOMY;  Surgeon: Linus Salmons, MD;  Location: [facility];  Service: ENT;  Laterality: Bilateral;   torn meniscus right knee surgery      TUBAL LIGATION      OB History   No obstetric history on file.      Home Medications    Prior to Admission medications   Medication Sig Start Date End Date Taking? Authorizing Provider  apixaban (ELIQUIS) 5 MG TABS tablet Take 5 mg by mouth 2 (two) times daily. Patient not taking: Reported on 10/26/2022    [provider]  EVENING PRIMROSE OIL PO Take by mouth daily.    [provider]  Fezolinetant (VEOZAH) 45 MG TABS Take 1 tablet by mouth daily. Patient not taking: Reported on 10/26/2022 02/09/22   [provider]  lisdexamfetamine (VYVANSE) 60 MG capsule Take 60 mg by mouth daily.  08/19/14   [provider]  MAGNESIUM CITRATE PO Take 2 mg by mouth daily.    [provider]  metoprolol succinate (TOPROL-XL) 25 MG 24 hr tablet Take 25 mg by mouth daily.    [provider]    Family History Family History  Problem Relation Age of Onset   Hypertension Father     Social History Social History   Tobacco Use   Smoking status: Never   Smokeless tobacco: Never  Vaping Use   Vaping Use: Never used  Substance Use Topics   Alcohol use: Yes    Comment: Social drinker    Drug use: No     Allergies   Clindamycin/lincomycin, Hydrocodone, Bactroban [mupirocin], Benzalkonium chloride, Capsicum annuum extract & derivative (bell pepper) [capsicum], Neosporin [bacitracin-polymyxin b], Oxycontin [oxycodone], Pineapple, Sulfa antibiotics, and Wound dressing adhesive   Review of Systems Review of Systems   Physical Exam Triage Vital Signs ED Triage Vitals  Enc Vitals Group     BP 10/26/22 1713 126/84     Pulse Rate 10/26/22 1713 82     Resp 10/26/22 1713 18     Temp 10/26/22 1713 98.9 F (37.2 C)     Temp  Source 10/26/22 1713 Oral     SpO2 10/26/22 1713 98 %     Weight --      Height --      Head Circumference --      Peak Flow --      Pain Score 10/26/22 1715 0     Pain Loc --      Pain Edu? --      Excl. in GC? --    No data found.  Updated Vital Signs BP 126/84 (BP Location: Left Arm)   Pulse 82   Temp 98.9 F (37.2 C) (Oral)   Resp 18   SpO2 98%   Visual Acuity Right Eye Distance:   Left Eye Distance:   Bilateral Distance:    Right Eye Near:   Left Eye Near:    Bilateral Near:     Physical Exam Vitals reviewed.  Constitutional:      Appearance: Normal appearance.  Skin:  General: Skin is warm and dry.     Findings: Rash present.       Neurological:     General: No focal deficit present.     Mental Status: She is alert and oriented to person, place, and time.  Psychiatric:        Mood and Affect: Mood normal.        Behavior: Behavior normal.      UC Treatments / Results  Labs (all labs ordered are listed, but only abnormal results are displayed) Labs Reviewed - No data to display  EKG   Radiology No results found.  Procedures Procedures (including critical care time)  Medications Ordered in UC Medications - No data to display  Initial Impression / Assessment and Plan / UC Course  I have reviewed the triage vital signs and the nursing notes.  Pertinent labs & imaging results that were available during my care of the patient were reviewed by me and considered in my medical decision making (see chart for details).   Claudia Hawkins is a 52 y.o. female presenting with contact dermatitis. Patient is afebrile without recent antipyretics, satting well on room air. Overall is well appearing, well hydrated, without respiratory distress.  Vesicular rash is present on the patient's back and upper buttocks.  Additional lesions are present on her hand.  Erythematous and pruritic.  Reviewed relevant chart history.   Presumed contact dermatitis from  nonfood plant.  Treating the patient with a prednisone taper.  Counseled patient on potential for adverse effects with medications prescribed/recommended today, ER and return-to-clinic precautions discussed, patient verbalized understanding and agreement with care plan.  Final Clinical Impressions(s) / UC Diagnoses   Final diagnoses:  None   Discharge Instructions   None    ED Prescriptions   None    PDMP not reviewed this encounter.   Charma Igo, [place] 10/26/22 1727

## 2022-10-26 NOTE — Discharge Instructions (Addendum)
Follow up here or with your primary care provider if your symptoms are worsening or not improving with treatment.     

## 2023-01-24 ENCOUNTER — Ambulatory Visit
Admission: RE | Admit: 2023-01-24 | Discharge: 2023-01-24 | Disposition: A | Discharge status: Home / Self Care | Payer: BC Managed Care – PPO | Source: Ambulatory Visit | Attending: Emergency Medicine | Admitting: Emergency Medicine

## 2023-01-24 ENCOUNTER — Other Ambulatory Visit: Payer: Self-pay

## 2023-01-24 VITALS — BP 143/94 | HR 100 | Temp 98.1°F | Resp 16 | Ht 63.0 in | Wt 138.0 lb

## 2023-01-24 DIAGNOSIS — J029 Acute pharyngitis, unspecified: Secondary | ICD-10-CM | POA: Diagnosis present

## 2023-01-24 DIAGNOSIS — B349 Viral infection, unspecified: Secondary | ICD-10-CM

## 2023-01-24 LAB — POCT RAPID STREP A (OFFICE): Rapid Strep A Screen: NEGATIVE

## 2023-01-24 NOTE — Discharge Instructions (Addendum)
Strep tes tis negative,culture pending.Most likely you have a viral illness: no antibiotic as indicated at this time, May treat with OTC meds of choice. Make sure to drink plenty of fluids to stay hydrated(gatorade, water, popsicles,jello,etc), avoid caffeine products. Follow up with PCP. Return as needed.

## 2023-01-24 NOTE — ED Triage Notes (Signed)
Pt here for sore throat, hoarseness, sinus pressure, headache and ear pain.  Sx started Wednesday.   Throat is red on exam white area noted.  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

## 2023-01-24 NOTE — ED Provider Notes (Signed)
Renaldo Fiddler    CSN: [id number] Arrival date & time: 01/24/23  1022      History   Chief Complaint Chief Complaint  Patient presents with   Sore Throat    Lost voice Wednesday and developed pressure in ear. Coughing mucus, headache, left ear pain, sore throat, hoarse voice, fever off and on, sinus pressure - Entered by patient    HPI Irma R Briones is a 52 y.o. female.   52 year old female pt, Aiyla Brinck, presents to urgent care for evaluation of sore throat, hoarseness, sinus pressure, headache and ear pain that started Wednesday.  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  Patient is using over-the-counter meds for symptom management.  The history is provided by the patient. No language interpreter was used.    Past Medical History:  Diagnosis Date   Adult ADHD    Allergic rhinitis    Asthma    very rarely uses albuterol    COVID-19 12/05/2019   Also 04/19/22 - resolved   DVT (deep venous thrombosis) (HCC) 11/18/2021   Right calf   Dysrhythmia    tachycardia    History of kidney stones    Tachycardia    Wears contact lenses     Patient Active Problem List   Diagnosis Date Noted   Sore throat 01/24/2023   Nonspecific syndrome suggestive of viral illness 01/24/2023   DVT (deep venous thrombosis) (HCC) 11/19/2021   Encounter for screening colonoscopy    Pelvic prolapse 01/29/2020   S/P LEEP (loop electrosurgical excision procedure) 10/27/2019   S/P endometrial ablation 10/27/2019   Tachycardia 10/26/2018   Palpitations 03/25/2015   Atrial tachycardia 03/25/2015   Asthma 03/25/2015   Attention deficit hyperactivity disorder 03/25/2015    Past Surgical History:  Procedure Laterality Date   ANTERIOR AND POSTERIOR REPAIR N/A 01/29/2020   Procedure: ANTERIOR (CYSTOCELE) AND POSTERIOR REPAIR (RECTOCELE) SACROSPINOUS LIGAMENT SUSPENSION;  Surgeon: Harold Hedge, MD;  Location: [facility] [place];  Service:  Gynecology;  Laterality: N/A;   BLADDER SUSPENSION N/A 01/29/2020   Procedure: Transobturator sling;  Surgeon: Harold Hedge, MD;  Location: [facility];  Service: Gynecology;  Laterality: N/A;   COLONOSCOPY WITH PROPOFOL N/A 11/18/2020   Procedure: COLONOSCOPY WITH PROPOFOL;  Surgeon: Midge Minium, MD;  Location: [facility];  Service: Endoscopy;  Laterality: N/A;   ETHMOIDECTOMY Bilateral 10/31/2021   Procedure: ETHMOIDECTOMY;  Surgeon: Linus Salmons, MD;  Location: [facility];  Service: ENT;  Laterality: Bilateral;   FRONTAL SINUS EXPLORATION Bilateral 10/31/2021   Procedure: FRONTAL SINUS EXPLORATION;  Surgeon: Linus Salmons, MD;  Location: [facility];  Service: ENT;  Laterality: Bilateral;   IMAGE GUIDED SINUS SURGERY N/A 10/31/2021   Procedure: IMAGE GUIDED SINUS SURGERY;  Surgeon: Linus Salmons, MD;  Location: [facility];  Service: ENT;  Laterality: N/A;  placed disk on OR charge nurse desk 6-02  kp per brenda disk is good. 6/2   LAPAROSCOPIC VAGINAL HYSTERECTOMY WITH SALPINGO OOPHORECTOMY Bilateral 01/29/2020   Procedure: LAPAROSCOPIC ASSISTED VAGINAL HYSTERECTOMY WITH SALPINGO OOPHORECTOMY;  Surgeon: Harold Hedge, MD;  Location: [facility] [place];  Service: Gynecology;  Laterality: Bilateral;  need bed   MAXILLARY ANTROSTOMY Bilateral 10/31/2021   Procedure: MAXILLARY ANTROSTOMY WITH TISSUE REMOVAL;  Surgeon: Linus Salmons, MD;  Location: [facility];  Service: ENT;  Laterality: Bilateral;   NASAL ENDOSCOPY Bilateral 05/01/2022   Procedure: NASAL ENDOSCOPY WITH EXCISION OF  REPARATVE GRANLATION TISSUE  WITH SYNECHIA;  Surgeon: Linus Salmons, MD;  Location: [facility];  Service: ENT;  Laterality: Bilateral;   NASAL TURBINATE REDUCTION Bilateral 10/31/2021   Procedure: TURBINATE REDUCTION/SUBMUCOSAL RESECTION;  Surgeon: Linus Salmons, MD;  Location: [facility];  Service: ENT;  Laterality:  Bilateral;   right knee surgery      right wrist surgery      SEPTOPLASTY N/A 10/31/2021   Procedure: SEPTOPLASTY;  Surgeon: Linus Salmons, MD;  Location: [facility];  Service: ENT;  Laterality: N/A;   SMALL INTESTINE SURGERY     52 years old   SPHENOIDECTOMY Bilateral 10/31/2021   Procedure: SPHENOIDECTOMY;  Surgeon: Linus Salmons, MD;  Location: [facility];  Service: ENT;  Laterality: Bilateral;   torn meniscus right knee surgery      TUBAL LIGATION      OB History   No obstetric history on file.      Home Medications    Prior to Admission medications   Medication Sig Start Date End Date Taking? Authorizing Provider  montelukast (SINGULAIR) 10 MG tablet Take 10 mg by mouth at bedtime.   Yes [provider]  apixaban (ELIQUIS) 5 MG TABS tablet Take 5 mg by mouth 2 (two) times daily. Patient not taking: Reported on 10/26/2022    [provider]  EVENING PRIMROSE OIL PO Take by mouth daily.    [provider]  Fezolinetant (VEOZAH) 45 MG TABS Take 1 tablet by mouth daily. Patient not taking: Reported on 10/26/2022 02/09/22   [provider]  lisdexamfetamine (VYVANSE) 60 MG capsule Take 60 mg by mouth daily.  08/19/14   [provider]  MAGNESIUM CITRATE PO Take 2 mg by mouth daily.    [provider]  metoprolol succinate (TOPROL-XL) 25 MG 24 hr tablet Take 25 mg by mouth daily.    [provider]  predniSONE (DELTASONE) 10 MG tablet Take 4 tablets (40 mg) for four days, then 2 tablets (20 mg) for four days, then 1 tablet (10 mg) for four days. 10/26/22   Immordino, Jeannett Senior, FNP    Family History Family History  Problem Relation Age of Onset   Hypertension Father     Social History Social History   Tobacco Use   Smoking status: Never   Smokeless tobacco: Never  Vaping Use   Vaping status: Never Used  Substance Use Topics   Alcohol use: Yes    Comment: Social drinker    Drug use: No      Allergies   Clindamycin/lincomycin, Hydrocodone, Bactroban [mupirocin], Benzalkonium chloride, Capsicum annuum extract & derivative (bell pepper) [capsicum], Neosporin [bacitracin-polymyxin b], Oxycontin [oxycodone], Pineapple, Sulfa antibiotics, and Wound dressing adhesive   Review of Systems Review of Systems  HENT:  Positive for congestion, ear pain, sinus pressure, sore throat and voice change.   Neurological:  Positive for headaches.  All other systems reviewed and are negative.    Physical Exam Triage Vital Signs ED Triage Vitals  Encounter Vitals Group     BP 01/24/23 1029 (!) 143/94     Systolic BP Percentile --      Diastolic BP Percentile --      Pulse Rate 01/24/23 1029 100     Resp 01/24/23 1029 16     Temp 01/24/23 1029 98.1 F (36.7 C)     Temp Source 01/24/23 1029 Oral     SpO2 01/24/23 1029 98 %     Weight 01/24/23 1030 138 lb (62.6 kg)  Height 01/24/23 1030 5\' 3"  (1.6 m)     Head Circumference --      Peak Flow --      Pain Score 01/24/23 1030 4     Pain Loc --      Pain Education --      Exclude from Growth Chart --    No data found.  Updated Vital Signs BP (!) 143/94   Pulse 100   Temp 98.1 F (36.7 C) (Oral)   Resp 16   Ht 5\' 3"  (1.6 m)   Wt 138 lb (62.6 kg)   SpO2 98%   BMI 24.45 kg/m   Visual Acuity Right Eye Distance:   Left Eye Distance:   Bilateral Distance:    Right Eye Near:   Left Eye Near:    Bilateral Near:     Physical Exam Vitals and nursing note reviewed.  Constitutional:      General: She is not in acute distress.    Appearance: She is well-developed.  HENT:     Head: Normocephalic.     Right Ear: Tympanic membrane is retracted.     Left Ear: Tympanic membrane is retracted.     Nose: Congestion present.     Mouth/Throat:     Lips: Pink.     Mouth: Mucous membranes are moist.     Pharynx: Oropharynx is clear.  Eyes:     General: Lids are normal.     Conjunctiva/sclera: Conjunctivae normal.      Pupils: Pupils are equal, round, and reactive to light.  Neck:     Trachea: No tracheal deviation.  Cardiovascular:     Rate and Rhythm: Normal rate and regular rhythm.     Pulses: Normal pulses.     Heart sounds: Normal heart sounds. No murmur heard. Pulmonary:     Effort: Pulmonary effort is normal.     Breath sounds: Normal breath sounds and air entry.  Abdominal:     General: Bowel sounds are normal.     Palpations: Abdomen is soft.     Tenderness: There is no abdominal tenderness.  Musculoskeletal:        General: Normal range of motion.     Cervical back: Normal range of motion.  Lymphadenopathy:     Cervical: No cervical adenopathy.  Skin:    General: Skin is warm and dry.     Findings: No rash.  Neurological:     General: No focal deficit present.     Mental Status: She is alert and oriented to person, place, and time.     GCS: GCS eye subscore is 4. GCS verbal subscore is 5. GCS motor subscore is 6.  Psychiatric:        Speech: Speech normal.        Behavior: Behavior normal. Behavior is cooperative.      UC Treatments / Results  Labs (all labs ordered are listed, but only abnormal results are displayed) Labs Reviewed  CULTURE, GROUP A STREP [facility])  POCT RAPID STREP A (OFFICE)    EKG   Radiology No results found.  Procedures Procedures (including critical care time)  Medications Ordered in UC Medications - No data to display  Initial Impression / Assessment and Plan / UC Course  I have reviewed the triage vital signs and the nursing notes.  Pertinent labs & imaging results that were available during my care of the patient were reviewed by me and considered in my medical decision making (see chart for  details).     Ddx: Sore throat, Viral illness,allergies Final Clinical Impressions(s) / UC Diagnoses   Final diagnoses:  Sore throat  Nonspecific syndrome suggestive of viral illness     Discharge Instructions      Strep tes tis  negative,culture pending.Most likely you have a viral illness: no antibiotic as indicated at this time, May treat with OTC meds of choice. Make sure to drink plenty of fluids to stay hydrated(gatorade, water, popsicles,jello,etc), avoid caffeine products. Follow up with PCP. Return as needed.     ED Prescriptions   None    PDMP not reviewed this encounter.   Clancy Gourd, NP 01/24/23 1204

## 2023-01-27 LAB — CULTURE, GROUP A STREP (THRC)

## 2023-05-08 IMAGING — CT CT MAXILLOFACIAL W/O CM
2 series · 13 of 37 positions shown, 16 images · non-contrast
Comparison: None.

CLINICAL DATA: Recurrent sinus infections

EXAM:
CT MAXILLOFACIAL WITHOUT CONTRAST
TECHNIQUE: Multidetector CT images of the paranasal sinuses were obtained using
the standard protocol without intravenous contrast.
RADIATION DOSE REDUCTION: This exam was performed according to the
departmental dose-optimization program which includes automated
exposure control, adjustment of the mA and/or kV according to
patient size and/or use of iterative reconstruction technique.

[Series 3: sinus soft · axial · 0.32mm/px · z∈[-184,-86]mm · 10 of 57 slices shown, 13 images (1 of 2)]
[im 4/57  brain]
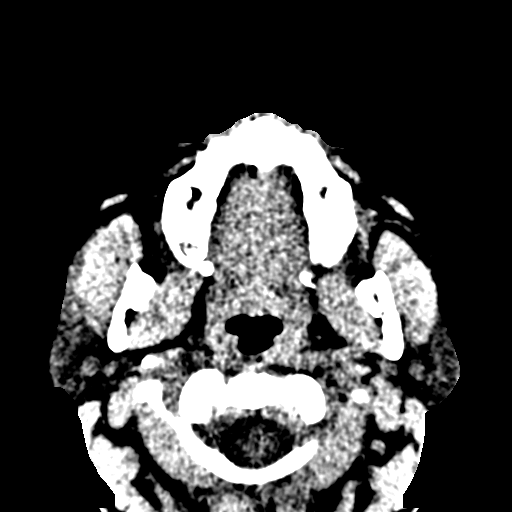
[im 4/57  bone]
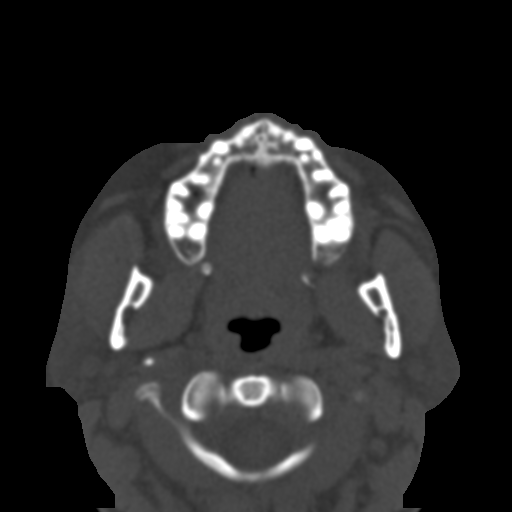
[im 10/57  bone]
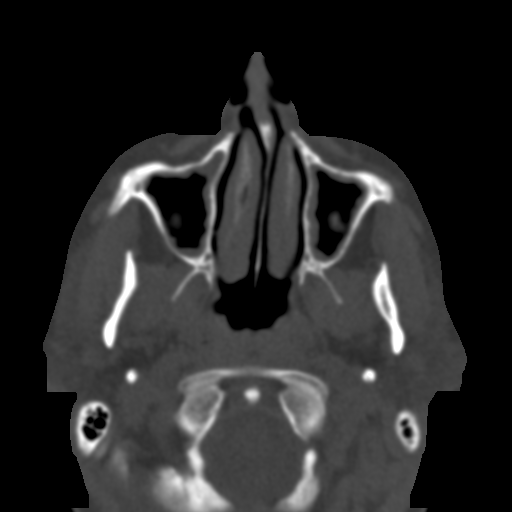
[im 16/57  bone]
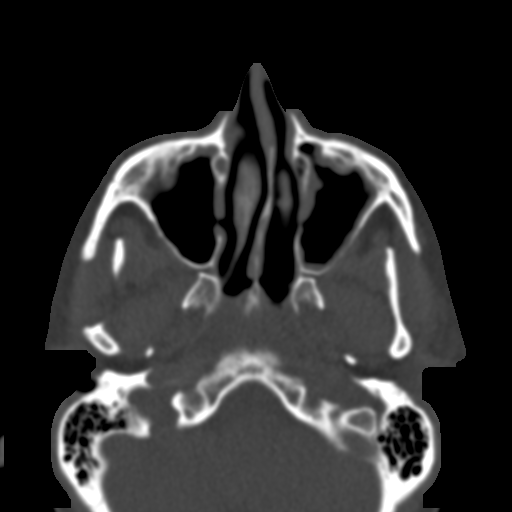
[im 20/57  bone]
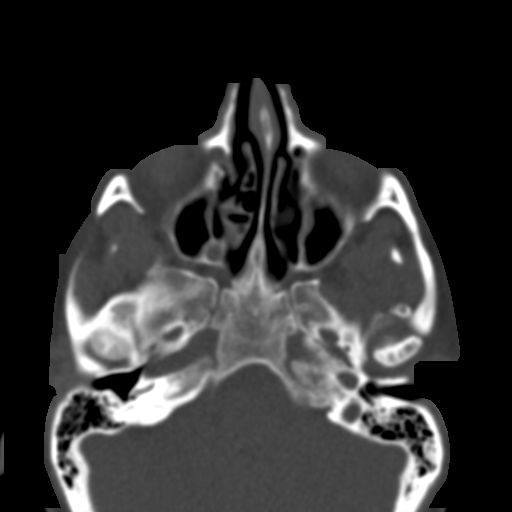
[im 26/57  brain]
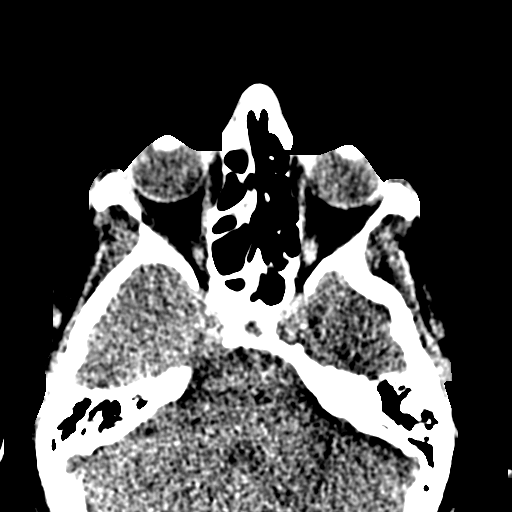
[im 26/57  bone]
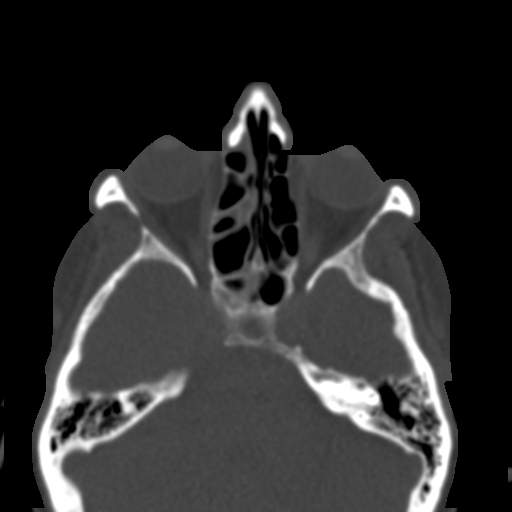
[im 31/57  bone]
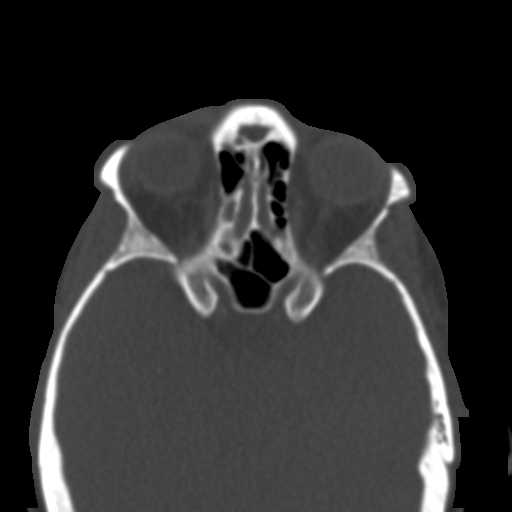
[im 37/57  bone]
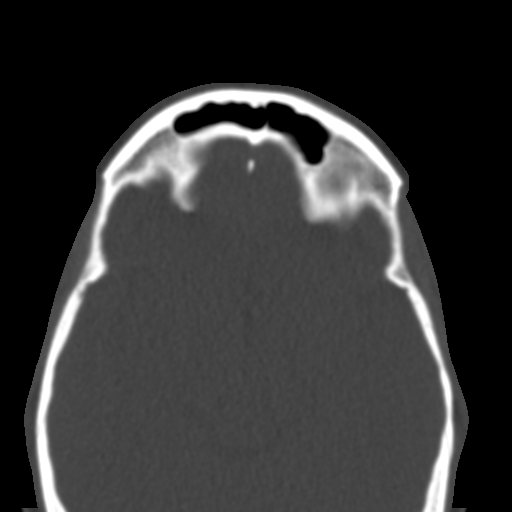
[im 43/57  bone]
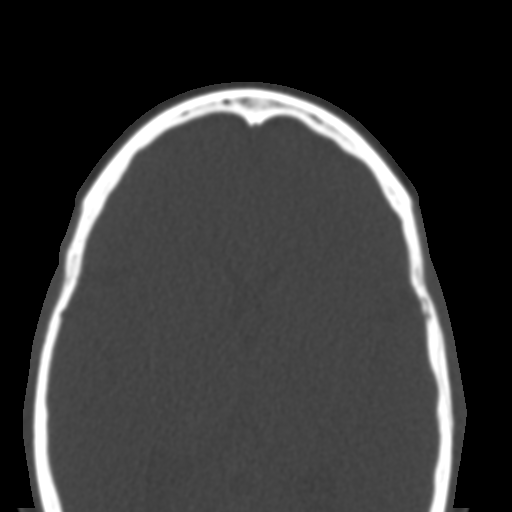
[im 47/57  brain]
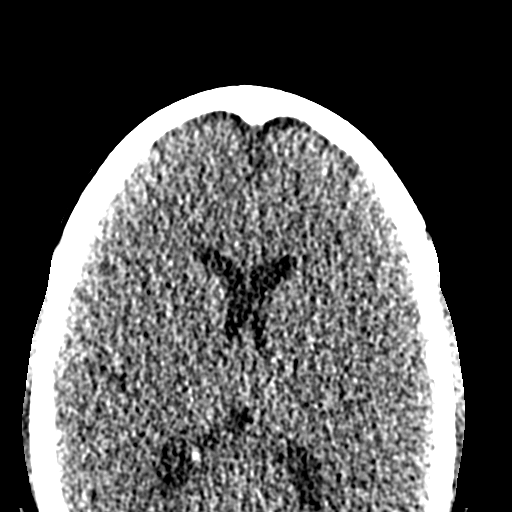
[im 47/57  bone]
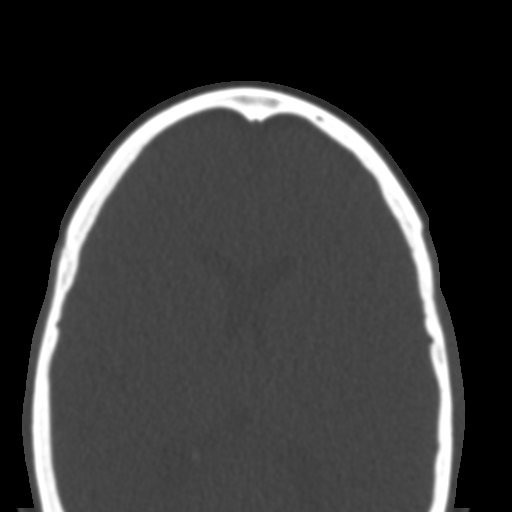
[im 53/57  bone]
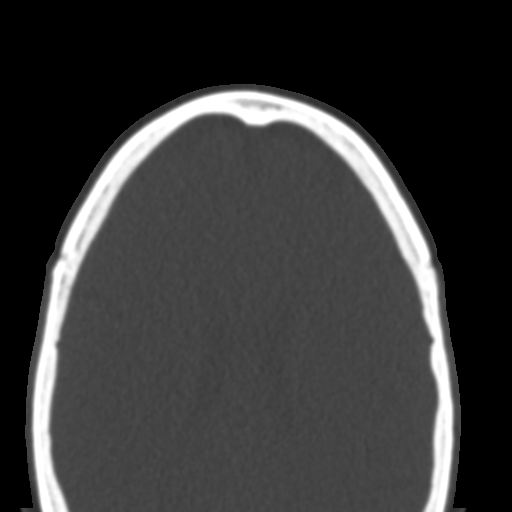

[Series 8: sinus soft · sagittal · 0.22mm/px · 3 of 102 slices shown (2 of 2)]
[im 34/102  bone]
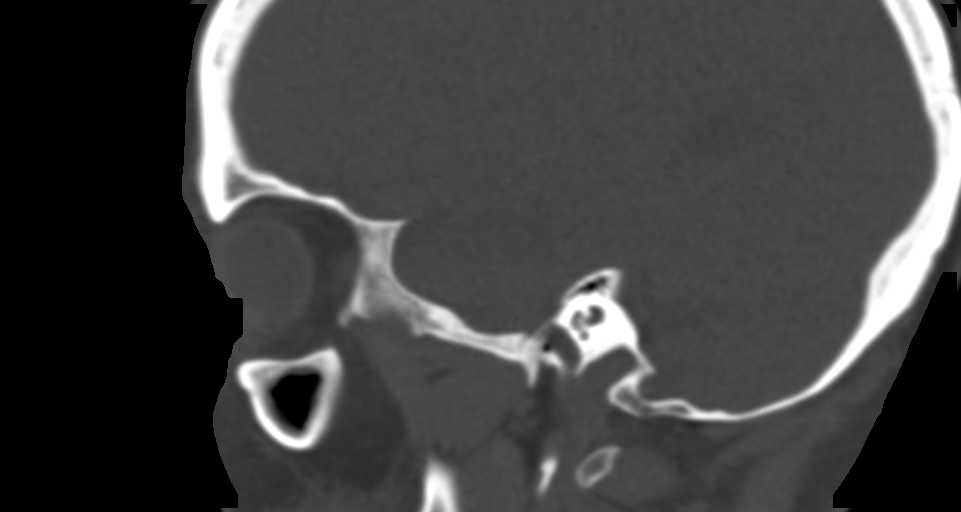
[im 51/102  bone]
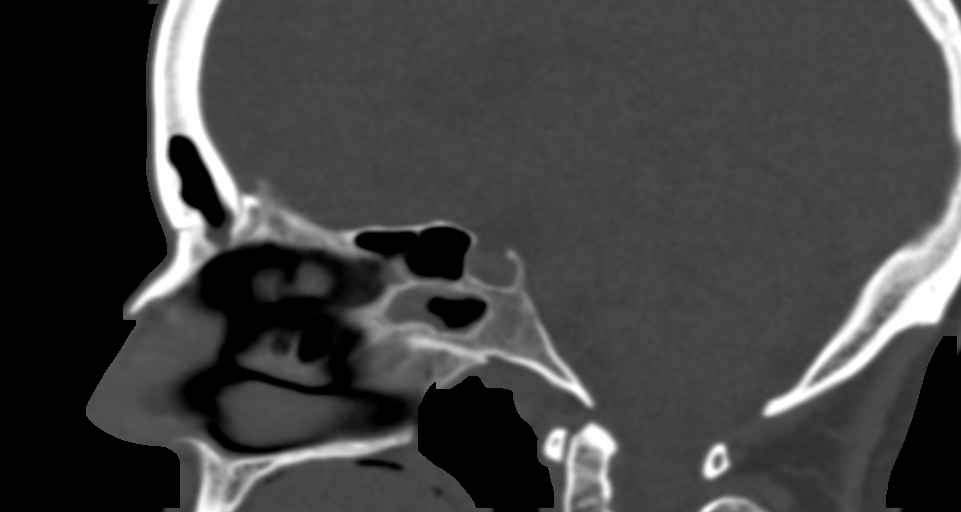
[im 68/102  bone]
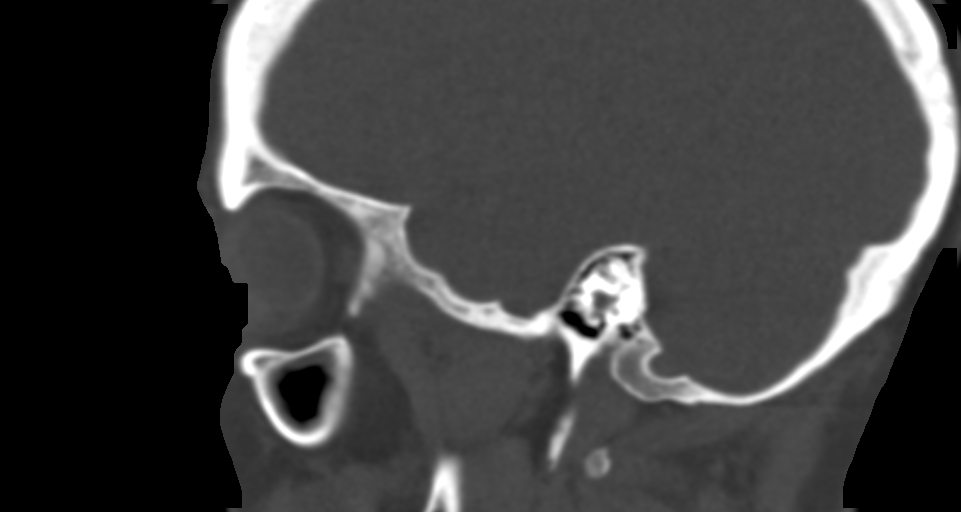

[13 of 37 positions shown; findings below may reference images not displayed]

FINDINGS: Paranasal sinuses:

Frontal: Minimal mucosal thickening in the inferior right frontal
sinus, with occluded right frontoethmoidal recess. The left frontal
sinus is normally aerated with a patent left frontoethmoidal recess.

Ethmoid: Mucosal thickening in the right ethmoid air cells.

Maxillary: Mild mucosal thickening in the left-greater-than-right
maxillary sinuses. No evidence of osseous erosion or thickening.

Sphenoid: Mucosal thickening in the right-greater-than-left
maxillary sinus. Mucosal thickening occludes the sphenoethmoidal
recesses.

Right ostiomeatal unit: Occluded by mild mucosal thickening.

Left ostiomeatal unit: Occluded by moderate mucosal thickening.

Nasal passages: Patent. Intact nasal septum is mildly deviated to
the left with a nasal spur. Right concha bullosa.

Anatomy: No pneumatization superior to anterior ethmoid notches.
Symmetric and intact olfactory grooves and fovea ethmoidalis, Keros
II (4-7mm). Sellar sphenoid pneumatization pattern.

Other: Orbits and intracranial compartment are unremarkable. Visible
mastoid air cells are normally aerated.
IMPRESSION: Mild mucosal thickening throughout the right-greater-than-left
paranasal sinuses, with mild occlusion of the sinus drainage
pathways. No osseous thickening to suggest chronic sinusitis.

## 2023-09-28 ENCOUNTER — Encounter (INDEPENDENT_AMBULATORY_CARE_PROVIDER_SITE_OTHER): Payer: Self-pay

## 2024-06-06 ENCOUNTER — Other Ambulatory Visit: Payer: Self-pay | Admitting: Obstetrics and Gynecology

## 2024-06-06 DIAGNOSIS — R928 Other abnormal and inconclusive findings on diagnostic imaging of breast: Secondary | ICD-10-CM

## 2024-06-07 ENCOUNTER — Inpatient Hospital Stay
Admission: RE | Admit: 2024-06-07 | Discharge: 2024-06-07 | Payer: Self-pay | Attending: Obstetrics and Gynecology | Admitting: Obstetrics and Gynecology

## 2024-06-07 DIAGNOSIS — R928 Other abnormal and inconclusive findings on diagnostic imaging of breast: Secondary | ICD-10-CM
